# Patient Record
Sex: Female | Born: 1980 | Race: Black or African American | Hispanic: No | Marital: Single | Smoking: Never smoker
Health system: Southern US, Community
[De-identification: ages and names within clinical notes are randomized; demographics above are authoritative.]

## PROBLEM LIST (undated history)

## (undated) DIAGNOSIS — R011 Cardiac murmur, unspecified: Secondary | ICD-10-CM

## (undated) DIAGNOSIS — E785 Hyperlipidemia, unspecified: Secondary | ICD-10-CM

## (undated) DIAGNOSIS — Z0000 Encounter for general adult medical examination without abnormal findings: Principal | ICD-10-CM

## (undated) DIAGNOSIS — Z98891 History of uterine scar from previous surgery: Secondary | ICD-10-CM

## (undated) DIAGNOSIS — K589 Irritable bowel syndrome without diarrhea: Secondary | ICD-10-CM

## (undated) HISTORY — DX: Hyperlipidemia, unspecified: E78.5

## (undated) HISTORY — DX: Cardiac murmur, unspecified: R01.1

## (undated) HISTORY — PX: WISDOM TOOTH EXTRACTION: SHX21

## (undated) HISTORY — DX: Encounter for general adult medical examination without abnormal findings: Z00.00

---

## 2011-02-12 LAB — HM COLONOSCOPY

## 2014-04-30 LAB — OB RESULTS CONSOLE HEPATITIS B SURFACE ANTIGEN: Hepatitis B Surface Ag: NEGATIVE

## 2014-04-30 LAB — OB RESULTS CONSOLE RUBELLA ANTIBODY, IGM: Rubella: IMMUNE

## 2014-04-30 LAB — OB RESULTS CONSOLE RPR: RPR: NONREACTIVE

## 2014-04-30 LAB — OB RESULTS CONSOLE ABO/RH: RH TYPE: POSITIVE

## 2014-04-30 LAB — OB RESULTS CONSOLE ANTIBODY SCREEN: ANTIBODY SCREEN: NEGATIVE

## 2014-04-30 LAB — OB RESULTS CONSOLE HIV ANTIBODY (ROUTINE TESTING): HIV: NONREACTIVE

## 2014-12-09 ENCOUNTER — Encounter (HOSPITAL_COMMUNITY): Payer: Self-pay | Admitting: *Deleted

## 2014-12-09 ENCOUNTER — Inpatient Hospital Stay (HOSPITAL_COMMUNITY)
Admission: AD | Admit: 2014-12-09 | Discharge: 2014-12-13 | DRG: 766 | Disposition: A | Discharge status: Home / Self Care | Payer: 59 | Source: Ambulatory Visit | Attending: Obstetrics and Gynecology | Admitting: Obstetrics and Gynecology

## 2014-12-09 DIAGNOSIS — K589 Irritable bowel syndrome without diarrhea: Secondary | ICD-10-CM | POA: Diagnosis present

## 2014-12-09 DIAGNOSIS — O9902 Anemia complicating childbirth: Secondary | ICD-10-CM | POA: Diagnosis present

## 2014-12-09 DIAGNOSIS — O9952 Diseases of the respiratory system complicating childbirth: Secondary | ICD-10-CM | POA: Diagnosis present

## 2014-12-09 DIAGNOSIS — Z3A38 38 weeks gestation of pregnancy: Secondary | ICD-10-CM | POA: Diagnosis present

## 2014-12-09 DIAGNOSIS — Z98891 History of uterine scar from previous surgery: Secondary | ICD-10-CM

## 2014-12-09 DIAGNOSIS — D649 Anemia, unspecified: Secondary | ICD-10-CM | POA: Diagnosis present

## 2014-12-09 HISTORY — DX: Irritable bowel syndrome, unspecified: K58.9

## 2014-12-09 HISTORY — DX: History of uterine scar from previous surgery: Z98.891

## 2014-12-09 LAB — TYPE AND SCREEN
ABO/RH(D): A POS
ANTIBODY SCREEN: NEGATIVE

## 2014-12-09 LAB — CBC
HCT: 37.9 % (ref 36.0–46.0)
Hemoglobin: 12.5 g/dL (ref 12.0–15.0)
MCH: 29.5 pg (ref 26.0–34.0)
MCHC: 33 g/dL (ref 30.0–36.0)
MCV: 89.4 fL (ref 78.0–100.0)
PLATELETS: 177 10*3/uL (ref 150–400)
RBC: 4.24 MIL/uL (ref 3.87–5.11)
RDW: 13.1 % (ref 11.5–15.5)
WBC: 8.8 10*3/uL (ref 4.0–10.5)

## 2014-12-09 NOTE — MAU Provider Note (Signed)
MAU Addendum Note  4-5/90/-3 BBW  Admit to L&D Pr desire water birth  Dagmawi Venable, CNM, MSN 12/09/2014. 10:18 PM

## 2014-12-09 NOTE — MAU Provider Note (Signed)
Mary Cunningham is a 34 y.o. G1P0 at 38.5 weeks presents to MAU c/o ctx, urge for BM and decreased FM.  She denies vb or lof.   History     There are no active problems to display for this patient.   Chief Complaint  Patient presents with  . Contractions   HPI  OB History    Gravida Para Term Preterm AB TAB SAB Ectopic Multiple Living   1               Past Medical History  Diagnosis Date  . Medical history non-contributory     Past Surgical History  Procedure Laterality Date  . Wisdom tooth extraction      No family history on file.  History  Substance Use Topics  . Smoking status: Never Smoker   . Smokeless tobacco: Not on file  . Alcohol Use: No    Allergies: Allergies not on file  No prescriptions prior to admission    ROS See HPI above, all other systems are negative  Physical Exam   There were no vitals taken for this visit.  Physical Exam Ext:  WNL ABD: Soft, non tender to palpation, no rebound or guarding SVE: 3/70/-4   ED Course  Assessment: IUP at  38.5weeks Membranes:intact FHR: Category 1 CTX:  8 minutes   Plan: NST Recheck in 1 hour    Anatalia Kronk, CNM, MSN 12/09/2014. 9:06 PM

## 2014-12-09 NOTE — H&P (Signed)
Mary Cunningham is a 34 y.o. female, G1 P0 at 38.6 weeks  Patient Active Problem List   Diagnosis Date Noted  . Active labor at term 12/10/2014    Pregnancy Course: Patient entered care at 12.2 weeks.   EDC of 3-26/16 was established by EDD.      US evaluations:   19.6 weeks - anatomy: EFW  12oz - 71.4%, cervical length 3.74, FHR 144, breech, anterior placenta, female    Significant prenatal events:   IBS, heart murmer as a child   Last evaluation:   38.3 weeks   VE:0/0/-4 on  3./15/16  Reason for admission labor  Pt States:   Contractions Frequency: 8 minutes         Contraction severity: moderate         Fetal activity: +FM  OB History    Gravida Para Term Preterm AB TAB SAB Ectopic Multiple Living   1              Past Medical History  Diagnosis Date  . Irritable bowel syndrome (IBS)    Past Surgical History  Procedure Laterality Date  . Wisdom tooth extraction     Family History: family history is not on file. Social History:  reports that she has never smoked. She does not have any smokeless tobacco history on file. She reports that she does not drink alcohol or use illicit drugs.   Prenatal Transfer Tool  Maternal Diabetes: No Genetic Screening: Normal Maternal Ultrasounds/Referrals: Normal Fetal Ultrasounds or other Referrals:  None Maternal Substance Abuse:  No Significant Maternal Medications:  None Significant Maternal Lab Results: None   ROS:  See HPI above, all other systems are negative  No Known Allergies Dilation: 4.5 Effacement (%): 90 Station: -2 Exam by:: cnm per pt Blood pressure 125/66, pulse 68, temperature 98.7 F (37.1 C), temperature source Oral, resp. rate 16, height 5\' 1"  (1.549 m), weight 214 lb (97.07 kg).  Maternal Exam:  Uterine Assessment: Contraction frequency is rare.  Abdomen: Gravid, non tender. Fundal height is aga.  Normal external genitalia, vulva, cervix, uterus and adnexa.  No lesions noted on exam.  Pelvis  adequate for delivery.  Fetal presentation: Vertex by VE  Fetal Exam:  Monitor Surveillance : Continuous Monitoring Mode: Ultrasound.  NICHD: Category 1 CTXs: occasional EFW   7 lbs  Physical Exam: Nursing note and vitals reviewed General: alert and cooperative She appears well nourished Psychiatric: Normal mood and affect. Her behavior is normal Head: Normocephalic Eyes: Pupils are equal, round, and reactive to light Neck: Normal range of motion Cardiovascular: RRR without murmur  Respiratory: CTAB. Effort normal  Abd: soft, non-tender, +BS, no rebound, no guarding  Genitourinary: Vagina normal  Neurological: A&Ox3 Skin: Warm and dry  Musculoskeletal: Normal range of motion  Homan's sign negative bilaterally No evidence of DVTs.  Edema: Minimal bilaterally non-pitting edema DTR: 2+ Clonus: None   Prenatal labs: ABO, Rh: --/--/A POS (03/17 2306) Antibody: NEG (03/17 2306) Rubella:   immune RPR: Nonreactive (08/07 0000)  HBsAg: Negative (08/07 0000)  HB C negative HIV: Non-reactive (08/07 0000)  GBS:  negative Sickle cell/Hgb electrophoresis:  WNL HSV1 & 2 negative Pap:  wnl GC:   negative Chlamydia: negative Genetic screenings:   Glucola:  wnl  Assessment:  IUP at 38.5 weeks NICHD: Category 1 Membranes: BBW GBS negative  Plan:  Admit to L&D for expectant/active management of labor. Possible augmentation options reviewed including foley bulb, AROM and/or pitocin.  IV pain medication  per orders PRN Epidural per patient request Foley cath after patient is comfortable with epidural Anticipate SVD  Labor mgmt as ordered Desires water birth   Okay to ambulate around unit with wireless monitors  Okay to get up and shower without monitoring   May auscultate FHR intermittently,  if expectant management     q 30 min in active labor     q 15 min in transition     q 5 min with pushing.     May ambulate without monitoring.     If no active labor, may do  NST q 2 hours.   Attending MD available at all times.  [name], CNM, MSN 12/10/2014, 12:58 AM       All information will be confirmed upon admisson

## 2014-12-09 NOTE — MAU Note (Signed)
Pt reportts having ctx ion and off all day got stronger this evening 5pm. Denies SROM or bleeding at this time. Reprots decreased fetal movement since ctx stared getting stronger.

## 2014-12-10 ENCOUNTER — Encounter (HOSPITAL_COMMUNITY)
Admission: AD | Disposition: A | Discharge status: Home / Self Care | Payer: Self-pay | Source: Ambulatory Visit | Attending: Obstetrics and Gynecology

## 2014-12-10 ENCOUNTER — Inpatient Hospital Stay (HOSPITAL_COMMUNITY): Payer: 59 | Admitting: Anesthesiology

## 2014-12-10 ENCOUNTER — Encounter (HOSPITAL_COMMUNITY): Payer: Self-pay | Admitting: Certified Registered Nurse Anesthetist

## 2014-12-10 ENCOUNTER — Encounter (HOSPITAL_COMMUNITY): Payer: Self-pay | Admitting: Anesthesiology

## 2014-12-10 LAB — OB RESULTS CONSOLE GBS: GBS: NEGATIVE

## 2014-12-10 LAB — ABO/RH: ABO/RH(D): A POS

## 2014-12-10 LAB — RPR: RPR Ser Ql: NONREACTIVE

## 2014-12-10 SURGERY — Surgical Case
Anesthesia: Regional

## 2014-12-10 MED ORDER — MORPHINE SULFATE (PF) 0.5 MG/ML IJ SOLN
INTRAMUSCULAR | Status: DC | PRN
  Administered 2014-12-10: 3 mg via EPIDURAL

## 2014-12-10 MED ORDER — ACETAMINOPHEN 500 MG PO TABS
1000.0000 mg | ORAL_TABLET | Freq: Four times a day (QID) | ORAL | Status: AC
  Administered 2014-12-10 – 2014-12-11 (×3): 1000 mg via ORAL
  Filled 2014-12-10 (×3): qty 2

## 2014-12-10 MED ORDER — LACTATED RINGERS IV SOLN
500.0000 mL | Freq: Once | INTRAVENOUS | Status: AC
  Administered 2014-12-10: 08:00:00 via INTRAVENOUS

## 2014-12-10 MED ORDER — OXYCODONE-ACETAMINOPHEN 5-325 MG PO TABS: 1.0000 | ORAL_TABLET | ORAL | Status: DC | PRN

## 2014-12-10 MED ORDER — MORPHINE SULFATE 0.5 MG/ML IJ SOLN
INTRAMUSCULAR | Status: AC
  Filled 2014-12-10: qty 10

## 2014-12-10 MED ORDER — IBUPROFEN 600 MG PO TABS
600.0000 mg | ORAL_TABLET | Freq: Four times a day (QID) | ORAL | Status: DC
  Administered 2014-12-10 – 2014-12-13 (×12): 600 mg via ORAL
  Filled 2014-12-10 (×12): qty 1

## 2014-12-10 MED ORDER — EPHEDRINE 5 MG/ML INJ: 10.0000 mg | INTRAVENOUS | Status: DC | PRN

## 2014-12-10 MED ORDER — NALBUPHINE HCL 10 MG/ML IJ SOLN: 5.0000 mg | Freq: Once | INTRAMUSCULAR | Status: AC | PRN

## 2014-12-10 MED ORDER — MENTHOL 3 MG MT LOZG: 1.0000 | LOZENGE | OROMUCOSAL | Status: DC | PRN

## 2014-12-10 MED ORDER — OXYTOCIN 10 UNIT/ML IJ SOLN
40.0000 [IU] | INTRAVENOUS | Status: DC | PRN
  Administered 2014-12-10: 40 [IU] via INTRAVENOUS

## 2014-12-10 MED ORDER — NALBUPHINE HCL 10 MG/ML IJ SOLN: 5.0000 mg | INTRAMUSCULAR | Status: DC | PRN

## 2014-12-10 MED ORDER — KETOROLAC TROMETHAMINE 30 MG/ML IJ SOLN
30.0000 mg | Freq: Four times a day (QID) | INTRAMUSCULAR | Status: AC | PRN
  Administered 2014-12-10: 30 mg via INTRAMUSCULAR

## 2014-12-10 MED ORDER — LACTATED RINGERS IV SOLN: 500.0000 mL | INTRAVENOUS | Status: DC | PRN

## 2014-12-10 MED ORDER — DIPHENHYDRAMINE HCL 25 MG PO CAPS: 25.0000 mg | ORAL_CAPSULE | ORAL | Status: DC | PRN

## 2014-12-10 MED ORDER — SENNOSIDES-DOCUSATE SODIUM 8.6-50 MG PO TABS
2.0000 | ORAL_TABLET | ORAL | Status: DC
  Administered 2014-12-10 – 2014-12-12 (×3): 2 via ORAL
  Filled 2014-12-10 (×3): qty 2

## 2014-12-10 MED ORDER — LANOLIN HYDROUS EX OINT: 1.0000 "application " | TOPICAL_OINTMENT | CUTANEOUS | Status: DC | PRN

## 2014-12-10 MED ORDER — ONDANSETRON HCL 4 MG/2ML IJ SOLN
INTRAMUSCULAR | Status: DC | PRN
  Administered 2014-12-10: 4 mg via INTRAVENOUS

## 2014-12-10 MED ORDER — NALOXONE HCL 0.4 MG/ML IJ SOLN: 0.4000 mg | INTRAMUSCULAR | Status: DC | PRN

## 2014-12-10 MED ORDER — SODIUM CHLORIDE 0.9 % IJ SOLN: 3.0000 mL | INTRAMUSCULAR | Status: DC | PRN

## 2014-12-10 MED ORDER — FLEET ENEMA 7-19 GM/118ML RE ENEM: 1.0000 | ENEMA | RECTAL | Status: DC | PRN

## 2014-12-10 MED ORDER — CEFAZOLIN SODIUM-DEXTROSE 2-3 GM-% IV SOLR
2.0000 g | INTRAVENOUS | Status: AC
  Administered 2014-12-10: 2 g via INTRAVENOUS
  Filled 2014-12-10: qty 50

## 2014-12-10 MED ORDER — OXYCODONE-ACETAMINOPHEN 5-325 MG PO TABS
2.0000 | ORAL_TABLET | ORAL | Status: DC | PRN
  Administered 2014-12-12: 2 via ORAL

## 2014-12-10 MED ORDER — PHENYLEPHRINE HCL 10 MG/ML IJ SOLN
INTRAMUSCULAR | Status: DC | PRN
  Administered 2014-12-10 (×3): 40 ug via INTRAVENOUS

## 2014-12-10 MED ORDER — SCOPOLAMINE 1 MG/3DAYS TD PT72
1.0000 | MEDICATED_PATCH | Freq: Once | TRANSDERMAL | Status: AC
  Administered 2014-12-10: 1.5 mg via TRANSDERMAL

## 2014-12-10 MED ORDER — KETOROLAC TROMETHAMINE 30 MG/ML IJ SOLN: 30.0000 mg | Freq: Four times a day (QID) | INTRAMUSCULAR | Status: AC | PRN

## 2014-12-10 MED ORDER — OXYTOCIN 40 UNITS IN LACTATED RINGERS INFUSION - SIMPLE MED
62.5000 mL/h | INTRAVENOUS | Status: AC
Start: 2014-12-10 — End: 2014-12-11

## 2014-12-10 MED ORDER — ZOLPIDEM TARTRATE 5 MG PO TABS: 5.0000 mg | ORAL_TABLET | Freq: Every evening | ORAL | Status: DC | PRN

## 2014-12-10 MED ORDER — ONDANSETRON HCL 4 MG/2ML IJ SOLN
INTRAMUSCULAR | Status: AC
  Filled 2014-12-10: qty 2

## 2014-12-10 MED ORDER — SCOPOLAMINE 1 MG/3DAYS TD PT72
MEDICATED_PATCH | TRANSDERMAL | Status: AC
  Filled 2014-12-10: qty 1

## 2014-12-10 MED ORDER — ONDANSETRON HCL 4 MG/2ML IJ SOLN: 4.0000 mg | Freq: Four times a day (QID) | INTRAMUSCULAR | Status: DC | PRN

## 2014-12-10 MED ORDER — LIDOCAINE HCL (PF) 1 % IJ SOLN
INTRAMUSCULAR | Status: DC | PRN
  Administered 2014-12-10 (×2): 4 mL

## 2014-12-10 MED ORDER — WITCH HAZEL-GLYCERIN EX PADS: 1.0000 "application " | MEDICATED_PAD | CUTANEOUS | Status: DC | PRN

## 2014-12-10 MED ORDER — DIPHENHYDRAMINE HCL 50 MG/ML IJ SOLN: 12.5000 mg | INTRAMUSCULAR | Status: DC | PRN

## 2014-12-10 MED ORDER — ONDANSETRON HCL 4 MG/2ML IJ SOLN: 4.0000 mg | Freq: Once | INTRAMUSCULAR | Status: DC | PRN

## 2014-12-10 MED ORDER — SIMETHICONE 80 MG PO CHEW
80.0000 mg | CHEWABLE_TABLET | ORAL | Status: DC
  Administered 2014-12-10 – 2014-12-12 (×3): 80 mg via ORAL
  Filled 2014-12-10 (×2): qty 1

## 2014-12-10 MED ORDER — DIPHENHYDRAMINE HCL 50 MG/ML IJ SOLN
12.5000 mg | INTRAMUSCULAR | Status: DC | PRN
  Administered 2014-12-10: 12.5 mg via INTRAVENOUS
  Filled 2014-12-10: qty 1

## 2014-12-10 MED ORDER — TETANUS-DIPHTH-ACELL PERTUSSIS 5-2.5-18.5 LF-MCG/0.5 IM SUSP: 0.5000 mL | Freq: Once | INTRAMUSCULAR | Status: DC

## 2014-12-10 MED ORDER — ACETAMINOPHEN 325 MG PO TABS: 650.0000 mg | ORAL_TABLET | ORAL | Status: DC | PRN

## 2014-12-10 MED ORDER — SIMETHICONE 80 MG PO CHEW
80.0000 mg | CHEWABLE_TABLET | Freq: Three times a day (TID) | ORAL | Status: DC
  Administered 2014-12-10 – 2014-12-13 (×8): 80 mg via ORAL
  Filled 2014-12-10 (×10): qty 1

## 2014-12-10 MED ORDER — LACTATED RINGERS IV SOLN
INTRAVENOUS | Status: DC | PRN
  Administered 2014-12-10 (×2): via INTRAVENOUS

## 2014-12-10 MED ORDER — EPHEDRINE 5 MG/ML INJ
10.0000 mg | INTRAVENOUS | Status: DC | PRN
Start: 2014-12-10 — End: 2014-12-10

## 2014-12-10 MED ORDER — FENTANYL 2.5 MCG/ML BUPIVACAINE 1/10 % EPIDURAL INFUSION (WH - ANES)
INTRAMUSCULAR | Status: DC | PRN
  Administered 2014-12-10: 14 mL/h via EPIDURAL

## 2014-12-10 MED ORDER — OXYCODONE-ACETAMINOPHEN 5-325 MG PO TABS
1.0000 | ORAL_TABLET | ORAL | Status: DC | PRN
  Administered 2014-12-11 – 2014-12-13 (×4): 1 via ORAL
  Filled 2014-12-10 (×6): qty 1

## 2014-12-10 MED ORDER — OXYTOCIN 10 UNIT/ML IJ SOLN
INTRAMUSCULAR | Status: AC
  Filled 2014-12-10: qty 4

## 2014-12-10 MED ORDER — MEPERIDINE HCL 25 MG/ML IJ SOLN
6.2500 mg | INTRAMUSCULAR | Status: DC | PRN
Start: 2014-12-10 — End: 2014-12-10

## 2014-12-10 MED ORDER — PHENYLEPHRINE 40 MCG/ML (10ML) SYRINGE FOR IV PUSH (FOR BLOOD PRESSURE SUPPORT)
80.0000 ug | PREFILLED_SYRINGE | INTRAVENOUS | Status: DC | PRN
Start: 2014-12-10 — End: 2014-12-10

## 2014-12-10 MED ORDER — CITRIC ACID-SODIUM CITRATE 334-500 MG/5ML PO SOLN
30.0000 mL | ORAL | Status: DC | PRN
  Administered 2014-12-10: 30 mL via ORAL
  Filled 2014-12-10: qty 15

## 2014-12-10 MED ORDER — PHENYLEPHRINE 40 MCG/ML (10ML) SYRINGE FOR IV PUSH (FOR BLOOD PRESSURE SUPPORT)
80.0000 ug | PREFILLED_SYRINGE | INTRAVENOUS | Status: DC | PRN
  Filled 2014-12-10: qty 20

## 2014-12-10 MED ORDER — LACTATED RINGERS IV SOLN: INTRAVENOUS | Status: DC

## 2014-12-10 MED ORDER — OXYTOCIN 40 UNITS IN LACTATED RINGERS INFUSION - SIMPLE MED: 62.5000 mL/h | INTRAVENOUS | Status: DC

## 2014-12-10 MED ORDER — DIBUCAINE 1 % RE OINT: 1.0000 "application " | TOPICAL_OINTMENT | RECTAL | Status: DC | PRN

## 2014-12-10 MED ORDER — SIMETHICONE 80 MG PO CHEW: 80.0000 mg | CHEWABLE_TABLET | ORAL | Status: DC | PRN

## 2014-12-10 MED ORDER — PRENATAL MULTIVITAMIN CH
1.0000 | ORAL_TABLET | Freq: Every day | ORAL | Status: DC
  Administered 2014-12-11 – 2014-12-13 (×3): 1 via ORAL
  Filled 2014-12-10 (×3): qty 1

## 2014-12-10 MED ORDER — ONDANSETRON HCL 4 MG/2ML IJ SOLN: 4.0000 mg | Freq: Three times a day (TID) | INTRAMUSCULAR | Status: DC | PRN

## 2014-12-10 MED ORDER — SODIUM BICARBONATE 8.4 % IV SOLN
INTRAVENOUS | Status: DC | PRN
  Administered 2014-12-10: 3 mL via EPIDURAL
  Administered 2014-12-10 (×2): 5 mL via EPIDURAL

## 2014-12-10 MED ORDER — LIDOCAINE HCL (PF) 1 % IJ SOLN: 30.0000 mL | INTRAMUSCULAR | Status: DC | PRN

## 2014-12-10 MED ORDER — OXYCODONE-ACETAMINOPHEN 5-325 MG PO TABS: 2.0000 | ORAL_TABLET | ORAL | Status: DC | PRN

## 2014-12-10 MED ORDER — BUTORPHANOL TARTRATE 1 MG/ML IJ SOLN
1.0000 mg | INTRAMUSCULAR | Status: DC | PRN
  Administered 2014-12-10: 1 mg via INTRAVENOUS
  Filled 2014-12-10: qty 1

## 2014-12-10 MED ORDER — LACTATED RINGERS IV SOLN
INTRAVENOUS | Status: DC
  Administered 2014-12-10: 19:00:00 via INTRAVENOUS

## 2014-12-10 MED ORDER — KETOROLAC TROMETHAMINE 30 MG/ML IJ SOLN
INTRAMUSCULAR | Status: AC
  Administered 2014-12-10: 30 mg via INTRAMUSCULAR
  Filled 2014-12-10: qty 1

## 2014-12-10 MED ORDER — DIPHENHYDRAMINE HCL 25 MG PO CAPS: 25.0000 mg | ORAL_CAPSULE | Freq: Four times a day (QID) | ORAL | Status: DC | PRN

## 2014-12-10 MED ORDER — FENTANYL 2.5 MCG/ML BUPIVACAINE 1/10 % EPIDURAL INFUSION (WH - ANES)
14.0000 mL/h | INTRAMUSCULAR | Status: DC | PRN
  Administered 2014-12-10: 14 mL/h via EPIDURAL
  Filled 2014-12-10: qty 125

## 2014-12-10 MED ORDER — NALOXONE HCL 1 MG/ML IJ SOLN
1.0000 ug/kg/h | INTRAVENOUS | Status: DC | PRN
  Filled 2014-12-10: qty 2

## 2014-12-10 MED ORDER — OXYTOCIN BOLUS FROM INFUSION: 500.0000 mL | INTRAVENOUS | Status: DC

## 2014-12-10 MED ORDER — LACTATED RINGERS IV SOLN
INTRAVENOUS | Status: DC | PRN
  Administered 2014-12-10: 09:00:00 via INTRAVENOUS

## 2014-12-10 MED ORDER — FENTANYL CITRATE 0.05 MG/ML IJ SOLN: 25.0000 ug | INTRAMUSCULAR | Status: DC | PRN

## 2014-12-10 SURGICAL SUPPLY — 37 items
CLAMP CORD UMBIL (MISCELLANEOUS) IMPLANT
CLOTH BEACON ORANGE TIMEOUT ST (SAFETY) ×3 IMPLANT
DERMABOND ADVANCED (GAUZE/BANDAGES/DRESSINGS) ×2
DERMABOND ADVANCED .7 DNX12 (GAUZE/BANDAGES/DRESSINGS) ×1 IMPLANT
DRAPE SHEET LG 3/4 BI-LAMINATE (DRAPES) IMPLANT
DRSG OPSITE POSTOP 4X10 (GAUZE/BANDAGES/DRESSINGS) ×3 IMPLANT
DURAPREP 26ML APPLICATOR (WOUND CARE) ×3 IMPLANT
ELECT REM PT RETURN 9FT ADLT (ELECTROSURGICAL) ×3
ELECTRODE REM PT RTRN 9FT ADLT (ELECTROSURGICAL) ×1 IMPLANT
EXTRACTOR VACUUM M CUP 4 TUBE (SUCTIONS) IMPLANT
EXTRACTOR VACUUM M CUP 4' TUBE (SUCTIONS)
GLOVE BIOGEL PI IND STRL 7.0 (GLOVE) ×1 IMPLANT
GLOVE BIOGEL PI INDICATOR 7.0 (GLOVE) ×2
GLOVE SURG SS PI 6.5 STRL IVOR (GLOVE) ×3 IMPLANT
GOWN STRL REUS W/TWL LRG LVL3 (GOWN DISPOSABLE) ×6 IMPLANT
KIT ABG SYR 3ML LUER SLIP (SYRINGE) IMPLANT
LIQUID BAND (GAUZE/BANDAGES/DRESSINGS) IMPLANT
NEEDLE HYPO 25X5/8 SAFETYGLIDE (NEEDLE) IMPLANT
NS IRRIG 1000ML POUR BTL (IV SOLUTION) ×3 IMPLANT
PACK C SECTION WH (CUSTOM PROCEDURE TRAY) ×3 IMPLANT
PAD ABD 7.5X8 STRL (GAUZE/BANDAGES/DRESSINGS) ×3 IMPLANT
PAD OB MATERNITY 4.3X12.25 (PERSONAL CARE ITEMS) ×3 IMPLANT
RTRCTR C-SECT PINK 25CM LRG (MISCELLANEOUS) ×3 IMPLANT
SUT CHROMIC 1 CTX 36 (SUTURE) IMPLANT
SUT CHROMIC 2 0 CT 1 (SUTURE) ×3 IMPLANT
SUT MON AB 4-0 PS1 27 (SUTURE) ×3 IMPLANT
SUT PLAIN 1 NONE 54 (SUTURE) IMPLANT
SUT PLAIN 2 0 (SUTURE)
SUT PLAIN 2 0 XLH (SUTURE) IMPLANT
SUT PLAIN ABS 2-0 CT1 27XMFL (SUTURE) IMPLANT
SUT VIC AB 0 CTX 36 (SUTURE) ×2
SUT VIC AB 0 CTX36XBRD ANBCTRL (SUTURE) ×1 IMPLANT
SUT VIC AB 1 CTX 36 (SUTURE) ×4
SUT VIC AB 1 CTX36XBRD ANBCTRL (SUTURE) ×2 IMPLANT
TAPE CLOTH .5X10 WHT NS (GAUZE/BANDAGES/DRESSINGS) ×3 IMPLANT
TOWEL OR 17X24 6PK STRL BLUE (TOWEL DISPOSABLE) ×3 IMPLANT
TRAY FOLEY CATH 14FR (SET/KITS/TRAYS/PACK) ×3 IMPLANT

## 2014-12-10 NOTE — Addendum Note (Signed)
Addendum  created 12/10/14 1408 by Yolonda KidaAlison L Deaveon Schoen, CRNA   Modules edited: Notes Section   Notes Section:  File: [id number]

## 2014-12-10 NOTE — Progress Notes (Signed)
Labor Progress LE  Subjective: Managing labor in the tub and the shower  Objective: BP 126/63 mmHg  Pulse 67  Temp(Src) 99.6 F (37.6 C) (Oral)  Resp 20  Ht 5\' 1"  (1.549 m)  Wt 214 lb (97.07 kg)  BMI 40.46 kg/m2     FHT: 145, + accel,  CTX:  regular, every 3-4 minutes Uterus gravid, soft non tender SVE:  7-8/90/-3   Assessment:  IUP at 38.6 weeks Membranes:  BBW Labor progress: adquate labor progress GBS: negative    Plan: Continue labor plan intermittent monitoring Rest /Ambulate Frequent position changes to facilitate fetal rotation and descent. Will reassess with cervical exam at 0630or earlier if necessary      Aleysha Meckler, CNM, MSN 12/10/2014. 6:00 AM

## 2014-12-10 NOTE — Anesthesia Postprocedure Evaluation (Signed)
  Anesthesia Post-op Note  Patient: Mary Cunningham  Procedure(s) Performed: Procedure(s) (LRB): CESAREAN SECTION (N/A)  Patient Location: PACU  Anesthesia Type: Spinal  Level of Consciousness: awake and alert   Airway and Oxygen Therapy: Patient Spontanous Breathing  Post-op Pain: mild  Post-op Assessment: Post-op Vital signs reviewed, Patient's Cardiovascular Status Stable, Respiratory Function Stable, Patent Airway and No signs of Nausea or vomiting  Last Vitals:  Filed Vitals:   12/10/14 1100  BP:   Pulse: 54  Temp:   Resp: 11    Post-op Vital Signs: stable   Complications: No apparent anesthesia complications

## 2014-12-10 NOTE — Procedures (Cosign Needed)
DATE OF SURGERY: 12/09/2013   PREOP DIAGNOSIS:  1. 38 week 6 day  EGA intrauterine pregnancy 2. Abnormal fetal heart tracing with prolonged deceleration.  2.  Remote from delivery at 8 cm dilation.    POSTOP DIAGNOSIS: Same as above.  PROCEDURE: Emergency Primary low uterine segment transverse cesarean section via Pfannenstiel incision.     SURGEON: Dr.  Angus Palms Sallye Ober  ASSISTANT: Gerrit Heck. CNM  ANESTHESIA: Epidural  COMPLICATIONS: None  FINDINGS: Viable female infant in cephalic presentation, DOA, weight 6 pounds  10 ounces, Apgar scores of 1 and 8. Normal uterus, fallopian tubes and ovaries bilaterally.    EBL: 500 cc  IV FLUID: See brief op note  URINE OUTPUT: See brief open note.  INDICATIONS: 34 y/o P0 who presented in labor.  She progressed to 8 cm and had recurrent variable decelerations then a prolonged deceleration that did not resolve with intrauterine resuscitation. As she was remote from delivery with abnormal fetal heart tracing it was decided to proceed with a primary cesarean section delivery.  She was consented for the procedure after explaining risks benefits and alternatives of the procedure including risks of bleeding infection and damage to organs.    PROCEDURE:   Informed consent was obtained from the patient to undergo the procedure. She was taken to the operating room where her epidural anesthesia was found to be adequate. She was prepped and draped in the usual sterile fashion and a Foley catheter was placed. She received 2 g of IV Ancef preoperatively. A Pfannenstiel incision was made with the scalpel and the incision extended through the subcutaneous layer and also the fascia. Small perforators in the subcutaneous layer were contained with the Bovie. The fascia was nicked in the midline and then was further separated from the rectus muscles bilaterally using Mayo scissors. Kochers were placed inferiorly and then superiorly to allow further separation of fascia  from the rectus muscles.  The peritoneal cavity was entered bluntly with the fingers. The Alexis retractor was placed in. The bladder flap was created using Metzenbaum scissors.   The uterus was incised with a scalpel and the incision extended bluntly bilaterally with fingers. Clear amniotic fluid was noted.  The head then the rest of the body was then delivered with abdominal pressure, nuchal cord was also noted with fetal head delivery.  She delivered a viable female infant, apgar scores 1, 8.  The cord was clamped and cut. Cord pH, cord blood were collected.    The uterus was exteriorized.  The placenta was delivered with gentle traction on the umbilical cord. The edges of the uterus was grasped with Allis clamps and also T. Clamps. The uterus was cleared of clots and debris with a lap.  The incision uterine incision was closed with #1 Vicryl in a running locked stitch. An imbricating layer of came stitch was placed over the initial closure. The uterus was returned into the abdomen.  Irrigation was applied and suctioned out. Excellent hemostasis was noted over the incision.  The muscles were then reapproximated using chromic suture.  Fascia was closed using 0 Vicryl in a running stitch. The subcutaneous layer was irrigated and suctioned out. Small perforators were contained with the bovie.  The subcutaneous was closed over using 1-0 plain in interrupted stitches. The skin was closed using 4-0 Monocryl. Dermabond then  Honeycomb then pressure dressing was applied. The patient was then cleaned and she was taken to the recovery room in stable condition. The neonate was also taken to  the nursery in stable condition.   SPECIMEN: Placenta, umbilical cord blood  DISPOSITION: TO PACU, STABLE.

## 2014-12-10 NOTE — Progress Notes (Signed)
Tylene Fantasiashlee S Marcotte MRN: [id number]  Subjective: -Patient reports rectal pressure.  States she "just wants to get him out."  Patient breathing well, with staff direction, through contractions.  Reports some contractions stronger than others.    Objective: BP 117/69 mmHg  Pulse 61  Temp(Src) 99.6 F (37.6 C) (Oral)  Resp 18  Ht 5\' 1"  (1.549 m)  Wt 214 lb (97.07 kg)  BMI 40.46 kg/m2     FHT: 145 bpm, Mod Var, Var. Decels, +Accels UC: Q3-404min, palpates strong   SVE:   Dilation: 8 Effacement (%): 80, 90 Station: -1 Exam by:: Herma CarsonLindsay Lima, RN Membranes: AROM at 0600 Pitocin: None  Assessment:  IUP at 38.6 weeks Cat II FT  Active Labor-Transition  Plan: -Cat II FT resolved with position change, continue to promote fetal descent and rotation -Patient instructed on nonpharmacologic techniques including breathing, position change, and guided imagery -Nurse call and report patient requests epidural--okay for epidural -Continue other mgmt as ordered  Tecla Mailloux LYNN,MSN, CNM 12/10/2014, 8:02 AM

## 2014-12-10 NOTE — Transfer of Care (Signed)
Immediate Anesthesia Transfer of Care Note  Patient: Mary Cunningham  Procedure(s) Performed: Procedure(s): CESAREAN SECTION (N/A)  Patient Location: PACU  Anesthesia Type:Epidural  Level of Consciousness: awake, alert , oriented and patient cooperative  Airway & Oxygen Therapy: Patient Spontanous Breathing  Post-op Assessment: Report given to RN and Post -op Vital signs reviewed and stable  Post vital signs: Reviewed and stable  Last Vitals:  Filed Vitals:   12/10/14 0842  BP: 118/63  Pulse: 96  Temp:   Resp:     Complications: No apparent anesthesia complications

## 2014-12-10 NOTE — Progress Notes (Addendum)
Mary Cunningham MRN: [id number]  Subjective: -Nurse call reporting prolonged decel.  In room to assess. Nurse reports cervix unchanged. Patient in left tilt, O2 on, S/P epidural-bp normal.  Objective: BP 112/68 mmHg  Pulse 94  Temp(Src) 99.6 F (37.6 C) (Oral)  Resp 18  Ht 5\' 1"  (1.549 m)  Wt 214 lb (97.07 kg)  BMI 40.46 kg/m2     FHT: 65 bpm x 8 mins, Mod Var UC:  None graphed  SVE:   Dilation: 8 Effacement (%): 80, 90 Station: -1 Exam by:: linsay lima Membranes: AROM Pitocin: None  Assessment:  IUP at 38.6wks Cat II FT  Active Labor-Transition Fetal Intolerance  Plan: -Placed in hands and knees position.  -Dr. Carmela HurtE. Emma Birchler called.  Recommendation for c/s due to NRFHT  -R/B of C/S discussed with patient, including risk of infection, bleeding, and damage to major organs and baby resulting in additional surgery. Patient understands and agrees to c/s -Consents signed -Prepped for C/S -2G ancef ordered -Necessary personnel notified  Gerrit HeckMLY, JESSICA [facility]byterian/Law[facility] HospitalYNN,MSN, CNM 12/10/2014, 8:41 AM   I examined fetal heart tracing before deciding whether cesarean delivery. I also spoke to the patient in the operating room before starting the procedure. And agree with the above findings assessment and plan.  Dr. Sallye OberKulwa.

## 2014-12-10 NOTE — Brief Op Note (Signed)
12/09/2014 - 12/10/2014  6:25 PM  PATIENT:  Mary Cunningham  34 y.o. female  PRE-OPERATIVE DIAGNOSIS:  1.  Abnormal fetal heart tracing with prolonged deceleration. 2.  Remote from delivery.  POST-OPERATIVE DIAGNOSIS:  1.  Abnormal fetal heart tracing with prolonged deceleration. 2.  Remote from delivery. Marland Kitchen.  PROCEDURE:  Procedure(s): CESAREAN SECTION (N/A)  SURGEON:  Surgeon(s) and Role:    * Hoover BrownsEma Kaide Gage, MD - Primary   ASSISTANTS: Gerrit HeckJessica Emly, CNM.   ANESTHESIA:   epidural  EBL:  Total I/O In: 2360 [P.O.:360; I.V.:2000] Out: 1375 [Urine:825; Blood:550]  BLOOD ADMINISTERED:none  DRAINS: none   LOCAL MEDICATIONS USED:  NONE  SPECIMEN:  Source of Specimen:  Placenta. Cord PH.   DISPOSITION OF SPECIMEN:  PATHOLOGY  COUNTS:  YES  TOURNIQUET:  * No tourniquets in log *  DICTATION: .Dragon Dictation  PLAN OF CARE: Admit to inpatient   PATIENT DISPOSITION:  PACU - hemodynamically stable.   Delay start of Pharmacological VTE agent (>24hrs) due to surgical blood loss or risk of bleeding: not applicable

## 2014-12-10 NOTE — Progress Notes (Signed)
Mary Cunningham  6 Hours Post-Op   S/P Primary LTCS 2ndary to NRFHT  Subjective: Patient up resting in bed with infant skin to skin.  Denies pain, nausea/vomiting, and flatus.    Objective: Filed Vitals:   12/10/14 1100 12/10/14 1126 12/10/14 1230 12/10/14 1330  BP:  98/53 100/62 111/58  Pulse: 54 50 66 59  Temp:  97.6 F (36.4 C) 98 F (36.7 C) 98.1 F (36.7 C)  TempSrc:   Axillary Oral  Resp: 11 16 18 18   Height:      Weight:      SpO2: 97% 100% 97% 98%    Recent Labs  12/09/14 2250  HGB 12.5  HCT 37.9    Physical Exam:  General: alert, cooperative and no distress Mood/Affect: Appropriate/Bright Lungs: clear to auscultation, no wheezes, rales or rhonchi, symmetric air entry.  Heart: normal rate and regular rhythm. Breast: Soft, Nipples Intact. Abdomen:  + bowel sounds in RUQ, - in all other quadrants, Soft, Pressure Dressing in Place Uterine Fundus: firm, U/-1 Lochia: appropriate Foley catheter: Draining clear yellow fluid Laceration: None Skin: Warm, Dry. DVT Evaluation: No evidence of DVT seen on physical exam. No significant calf/ankle edema. SCD boots on and operational  Assessment 6 Hrs Post Op Hemodynamically Stable BreastFeeding  Plan: Discussed POC including advancement of movement and diet Pain management discussed Bowel function discussed No questions or concerns Desires inpatient circumcision-R/B discussed, questions/concerns addressed-consent signed Continue current care Dr. Carmela HurtE. Kulwa to be updated on patient status   Mary Cunningham, Mary FasterJESSICA LYNN, MSN, CNM 12/10/2014, 3:27 PM

## 2014-12-10 NOTE — Anesthesia Preprocedure Evaluation (Signed)
Anesthesia Evaluation  Patient identified by MRN, date of birth, ID band Patient awake    Reviewed: Allergy & Precautions, NPO status , Patient's Chart, lab work & pertinent test results  History of Anesthesia Complications Negative for: history of anesthetic complications  Airway Mallampati: II  TM Distance: >3 FB Neck ROM: Full    Dental no notable dental hx. (+) Dental Advisory Given   Pulmonary neg pulmonary ROS,  breath sounds clear to auscultation  Pulmonary exam normal       Cardiovascular Exercise Tolerance: Good negative cardio ROS  Rhythm:Regular Rate:Normal     Neuro/Psych negative neurological ROS  negative psych ROS   GI/Hepatic negative GI ROS, Neg liver ROS,   Endo/Other  negative endocrine ROS  Renal/GU negative Renal ROS  negative genitourinary   Musculoskeletal negative musculoskeletal ROS (+)   Abdominal   Peds negative pediatric ROS (+)  Hematology negative hematology ROS (+)   Anesthesia Other Findings   Reproductive/Obstetrics (+) Pregnancy                             Anesthesia Physical Anesthesia Plan  ASA: II  Anesthesia Plan: Epidural   Post-op Pain Management:    Induction:   Airway Management Planned:   Additional Equipment:   Intra-op Plan:   Post-operative Plan:   Informed Consent: I have reviewed the patients History and Physical, chart, labs and discussed the procedure including the risks, benefits and alternatives for the proposed anesthesia with the patient or authorized representative who has indicated his/her understanding and acceptance.   Dental advisory given  Plan Discussed with:   Anesthesia Plan Comments:         Anesthesia Quick Evaluation

## 2014-12-10 NOTE — Anesthesia Postprocedure Evaluation (Signed)
  Anesthesia Post-op Note  Patient: Mary Cunningham  Procedure(s) Performed: Procedure(s): CESAREAN SECTION (N/A)  Patient Location: Mother/Baby  Anesthesia Type:Epidural  Level of Consciousness: awake, alert , oriented and patient cooperative  Airway and Oxygen Therapy: Patient Spontanous Breathing  Post-op Pain: mild  Post-op Assessment: Post-op Vital signs reviewed, Patient's Cardiovascular Status Stable, Respiratory Function Stable, Patent Airway, No headache, No backache, No residual numbness and No residual motor weakness  Post-op Vital Signs: Reviewed and stable  Last Vitals:  Filed Vitals:   12/10/14 1126  BP: 98/53  Pulse: 50  Temp: 36.4 C  Resp: 16    Complications: No apparent anesthesia complications

## 2014-12-10 NOTE — Anesthesia Procedure Notes (Addendum)
Epidural Patient location during procedure: OB Start time: 12/10/2014 8:18 AM  Staffing Anesthesiologist: Karie SchwalbeJUDD, Samual Beals Performed by: anesthesiologist   Preanesthetic Checklist Completed: patient identified, site marked, surgical consent, pre-op evaluation, timeout performed, IV checked, risks and benefits discussed and monitors and equipment checked  Epidural Patient position: sitting Prep: site prepped and draped and DuraPrep Patient monitoring: continuous pulse ox and blood pressure Approach: midline Location: L3-L4 Injection technique: LOR saline  Needle:  Needle type: Tuohy  Needle gauge: 17 G Needle length: 9 cm and 9 Needle insertion depth: 8 cm Catheter type: closed end flexible Catheter size: 19 Gauge Catheter at skin depth: 13 cm Test dose: negative  Assessment Events: blood not aspirated, injection not painful, no injection resistance, negative IV test and no paresthesia  Additional Notes Patient identified. Risks/Benefits/Options discussed with patient including but not limited to bleeding, infection, nerve damage, paralysis, failed block, incomplete pain control, headache, blood pressure changes, nausea, vomiting, reactions to medication both or allergic, itching and postpartum back pain. Confirmed with bedside nurse the patient's most recent platelet count. Confirmed with patient that they are not currently taking any anticoagulation, have any bleeding history or any family history of bleeding disorders. Patient expressed understanding and wished to proceed. All questions were answered. Sterile technique was used throughout the entire procedure. Please see nursing notes for vital signs. Test dose was given through epidural catheter and negative prior to continuing to dose epidural or start infusion. Warning signs of high block given to the patient including shortness of breath, tingling/numbness in hands, complete motor block, or any concerning symptoms with instructions to  call for help. Patient was given instructions on fall risk and not to get out of bed. All questions and concerns addressed with instructions to call with any issues or inadequate analgesia.

## 2014-12-10 NOTE — Progress Notes (Signed)
Labor Progress  Subjective: 0600 Pt starting to feel over whelmed, asking for IV pain medication.  Discussed AROM, pt agreed. Pt tolerated well 0615 call back to room stat for prolong decel Dr Normand Sloopillard called  Objective: BP 126/63 mmHg  Pulse 67  Temp(Src) 99.6 F (37.6 C) (Oral)  Resp 20  Ht 5\' 1"  (1.549 m)  Wt 214 lb (97.07 kg)  BMI 40.46 kg/m2     FHT: 145, moderate variability, + accel prolong decels down to the 70 for a total of 14 minutes, w/return to baseline with moderate varibility  CTX:  regular, every 3-4 minutes Uterus gravid, soft non tender SVE:  Dilation: 8 Effacement (%): 90 Station: -2 Exam by:: V.Gerado Nabers,CNM  Assessment:  IUP at 38.3 weeks NICHD: Category 3 with active intrauterine resuscitative measures.  VE unremarkable for cord prolapse Membranes:  AROM x 15 minutes Labor progress: transition GBS: negative FSE placed  0700 Category 2, FHR 120, moderate variability, occasional accel, variable decels   Plan: Continue labor planContinuous monitoring Frequent position changes to facilitate fetal rotation and descent. Report given to Dr Normand Sloopillard and Gerrit HeckJessica Emly CNM      Angelo Prindle, CNM, MSN 12/10/2014. 6:09 AM

## 2014-12-11 LAB — CBC
HEMATOCRIT: 31.3 % — AB (ref 36.0–46.0)
Hemoglobin: 10.1 g/dL — ABNORMAL LOW (ref 12.0–15.0)
MCH: 29.4 pg (ref 26.0–34.0)
MCHC: 32.6 g/dL (ref 30.0–36.0)
MCV: 90.2 fL (ref 78.0–100.0)
Platelets: 166 10*3/uL (ref 150–400)
RBC: 3.47 MIL/uL — ABNORMAL LOW (ref 3.87–5.11)
RDW: 13.3 % (ref 11.5–15.5)
WBC: 13.9 10*3/uL — ABNORMAL HIGH (ref 4.0–10.5)

## 2014-12-11 NOTE — Lactation Note (Signed)
This note was copied from the chart of Mary Cunningham. Lactation Consultation Note  Patient Name: Mary Helaine Chessshlee Rea     Follow up consult with this mom and term baby, now 5524 hours old. Mom was having a difficult time latching on her right breast, which has an inverted nipple. Baby breast feeds very well form her left breast. i assisted mom with positioning and latching baby in football hold. He could not maintain latch until we added a 20 nipple shield. He suckled on this side for about 10 minutes, and then mom switched him to her left breast. After the shield was removed, there was no colostrum seen in the shield. I advised mom to begin each feeding on the right with the shield, and end with the left, and to allow the baby to feed as long as he wants. i also set up a DEP to protect mom's supply, and suggested she pump about 4 times a day, or every 6 hours. Mom knows to call for questions/cloncerns.   Today's Date: 12/11/2014     Maternal Data    Feeding    LATCH Score/Interventions                      Lactation Tools Discussed/Used     Consult Status      Alfred LevinsLee, Marvell Tamer Anne 12/11/2014, 2:27 PM

## 2014-12-11 NOTE — Progress Notes (Addendum)
Subjective: Postpartum Day 1: Cesarean Delivery due to NRFHT Patient up ad lib, reports no syncope or dizziness. Feeding:  breast Contraceptive plan:  Mirena  Objective: Vital signs in last 24 hours: Temp:  [97.6 F (36.4 C)-98.5 F (36.9 C)] 98.2 F (36.8 C) (03/19 0930) Pulse Rate:  [50-83] 83 (03/19 0930) Resp:  [11-22] 18 (03/19 0930) BP: (86-118)/(41-69) 108/66 mmHg (03/19 0930) SpO2:  [96 %-100 %] 100 % (03/19 0930)  Physical Exam:  General: alert and cooperative Lochia: appropriate Uterine Fundus: firm Abdomen:  + bowel sounds, non distended Incision: no significant drainage  Pressure dressing CDI DVT Evaluation: No evidence of DVT seen on physical exam. Homan's sign: Negative   Recent Labs  12/09/14 2250 12/11/14 0619  HGB 12.5 10.1*  HCT 37.9 31.3*  WBC 8.8 13.9*    Assessment: Status post Cesarean section day 1. Doing well postoperatively.  pressure dressing in place, no significant drainage Anemia - hemodynamicly stable.   Plan: Continue current care. Lactation consult and Circumcision prior to discharge Remove outer dressing later today    Venus Standard, CNM, MSN 12/11/2014. 10:03 AM  I saw and examined patient at bedside and agree with above findings assessment and plan.  Discussed risks benefits and alternatives of circumcising her neonate including risks of bleeding infection and damage to organs. All questions were answered.  May remove pressure dressing after showering.  Dr. Sallye OberKulwa.

## 2014-12-12 ENCOUNTER — Encounter (HOSPITAL_COMMUNITY): Payer: Self-pay | Admitting: Obstetrics and Gynecology

## 2014-12-12 DIAGNOSIS — Z98891 History of uterine scar from previous surgery: Secondary | ICD-10-CM

## 2014-12-12 HISTORY — DX: History of uterine scar from previous surgery: Z98.891

## 2014-12-12 NOTE — Lactation Note (Signed)
This note was copied from the chart of Mary Helaine Chessshlee Dimascio. Lactation Consultation Note  Patient Name: Mary Cunningham [id number]'WToday's Date: 12/12/2014 Reason for consult: Follow-up assessment   With this mom of a term baby, now 4550 hours old. Mom is concerned that the baby will not easily latch to her right breast, but is doing well with nipple shield. Mom reports milk seen in shield today. Mom's nurse, Brooke, assisted mom with latching baby in football hold, and I walked in with baby latched, lots of audible swalows. I placed pillows behind mom's back, and advised her to sit back, and mom was relieved to see how much more comfortable this was. Mom was advised to pump about 4 times a day to protect her supply, since she is using a nipple shield on the right breast. I gave mom her DEP s a [employer] employee. Mom knows to call for questions/concerns.   Maternal Data    Feeding Feeding Type: Breast Fed Length of feed: 30 min  LATCH Score/Interventions Latch: Grasps breast easily, tongue down, lips flanged, rhythmical sucking.  Audible Swallowing: Spontaneous and intermittent  Type of Nipple: Everted at rest and after stimulation  Comfort (Breast/Nipple): Soft / non-tender     Hold (Positioning): Assistance needed to correctly position infant at breast and maintain latch. Intervention(s): Breastfeeding basics reviewed;Support Pillows;Position options;Skin to skin  LATCH Score: 9  Lactation Tools Discussed/Used     Consult Status Consult Status: Follow-up Date: 12/13/14 Follow-up type: In-patient    Alfred LevinsLee, Santia Labate Anne 12/12/2014, 1:16 PM

## 2014-12-12 NOTE — Progress Notes (Signed)
Mary Cunningham [id number]  Subjective: Postpartum Day 2: [facility]. Patient up ad lib, reports no syncope or dizziness.  No flatus yet. Feeding:  Breast Contraceptive plan:  Mirena  Objective: Temp:  [98 F (36.7 C)-98.2 F (36.8 C)] 98.1 F (36.7 C) (03/20 0607) Pulse Rate:  [71-83] 71 (03/20 0607) Resp:  [18] 18 (03/20 0607) BP: (105-108)/(57-66) 105/60 mmHg (03/20 0607) SpO2:  [100 %] 100 % (03/20 0607)   Recent Labs  12/09/14 2250 12/11/14 0619  HGB 12.5 10.1*  HCT 37.9 31.3*  WBC 8.8 13.9*    Physical Exam:  General: alert Lochia: appropriate Uterine Fundus: firm Abdomen:  + bowel sounds, mild gaseous distension Incision: Honeycomb dressing CDI DVT Evaluation: No evidence of DVT seen on physical exam. Negative Homan's sign.   Assessment/Plan: Status post cesarean delivery, day 2 Stable Continue current care. Plan for discharge tomorrow  Encouraged ambulation to facilitate bowel function.    Mary Cunningham, Mary Abdulla MSN, CNM 12/12/2014, 9:22 AM

## 2014-12-13 ENCOUNTER — Encounter (HOSPITAL_COMMUNITY): Payer: Self-pay | Admitting: Obstetrics & Gynecology

## 2014-12-13 MED ORDER — IBUPROFEN 600 MG PO TABS: 600.0000 mg | ORAL_TABLET | Freq: Four times a day (QID) | ORAL | Status: DC

## 2014-12-13 MED ORDER — FERROUS SULFATE 325 (65 FE) MG PO TBEC: 325.0000 mg | DELAYED_RELEASE_TABLET | Freq: Two times a day (BID) | ORAL | Status: DC

## 2014-12-13 MED ORDER — OXYCODONE-ACETAMINOPHEN 5-325 MG PO TABS: 1.0000 | ORAL_TABLET | ORAL | Status: DC | PRN

## 2014-12-13 NOTE — Discharge Summary (Signed)
Cesarean Section Delivery Discharge Summary  Mary Cunningham  DOB:    06-Aug-1981 MRN:    [id number] CSN:    [id number]  Date of admission:                  12/09/14  Date of discharge:                   12/13/14  Procedures this admission: primary CS  Date of Delivery:  12/10/14  Newborn Data:  Live born  Information for the patient's newborn:  Mary, Cunningham [[id number]]  female   Live born female  Birth Weight: 6 lb 10 oz (3005 g) APGAR: 1, 8  Home with mother. Name: Mary Cunningham Circumcision Plan: in patient  History of Present Illness:  [name] is a 34 y.o. female, G1P1001, who presents at [redacted]w[redacted]d weeks gestation. The patient has been followed at the [facility] division of [facility].    Her pregnancy has been complicated by:  Patient Active Problem List   Diagnosis Date Noted  . Status post primary low transverse cesarean section 12/12/2014    Hospital course: The patient was admitted for labor.   Her postpartum course was not complicated.  She was discharged to home on postpartum day 3 doing well.  Feeding: breast  Contraception: IUD Pt understands the risks are but not limited to irregular bleeding, formation of DVT, fluid fluctuations, elevation in blood pressure, stroke, breast tenderness and liver damage.  She states she will report any serious side effects.  She has been given verbal and written instructions and voiced a clear understanding.    Discharge hemoglobin: HEMOGLOBIN  Date Value Ref Range Status  12/11/2014 10.1* 12.0 - 15.0 g/dL Final    Comment:    REPEATED TO VERIFY DELTA CHECK NOTED    HCT  Date Value Ref Range Status  12/11/2014 31.3* 36.0 - 46.0 % Final   Anemia - hemodynamicly stable.    PreNatal Labs ABO, Rh: --/--/A POS, A POS (03/17 2306)   Antibody: NEG (03/17 2306)  Rubella:   immune RPR: Non Reactive (03/17 2250)  HBsAg: Negative (08/07 0000)  HIV:  Non-reactive (08/07 0000)  GBS: Negative (03/18 0000)  Discharge Physical Exam:  General: alert and cooperative Lochia: appropriate Uterine Fundus: firm Incision: healing well DVT Evaluation: No evidence of DVT seen on physical exam.  Intrapartum Procedures: cesarean: low cervical, transverse Postpartum Procedures: none Complications-Operative and Postpartum: none  Discharge Diagnoses: Term Pregnancy-delivered,  asymptomatic anemia  Discharge Information:  Activity:           pelvic rest Diet:                routine Medications: None, Ibuprofen, Iron and Percocet Condition:      stable   Discharge to: home  Follow-up Information    Follow up with [facility] In 6 weeks.   Specialty:  Obstetrics and Gynecology   Why:  Postpartum check up   Contact information:   [address] [place] [address] [phone]       Adelina Mings, CNM, MSN  12/13/2014. 7:39 AM  Care After Cesarean Delivery  Refer to this sheet in the next few weeks. These instructions provide you with information on caring for yourself after your procedure. Your caregiver may also give you specific instructions. Your treatment has been planned according to current medical practices, but problems sometimes occur. Call your  caregiver if you have any problems or questions after you go home. HOME CARE INSTRUCTIONS  Only take over-the-counter or prescription medicines as directed by your caregiver.  Do not drink alcohol, especially if you are breastfeeding or taking medicine to relieve pain.  Do not chew or smoke tobacco.  Continue to use good perineal care. Good perineal care includes:  Wiping your perineum from front to back.  Keeping your perineum clean.  Check your cut (incision) daily for increased redness, drainage, swelling, or separation of skin.  Clean your incision gently with soap and water every day, and then pat it dry. If  your caregiver says it is okay, leave the incision uncovered. Use a bandage (dressing) if the incision is draining fluid or appears irritated. If the adhesive strips across the incision do not fall off within 7 days, carefully peel them off.  Hug a pillow when coughing or sneezing until your incision is healed. This helps to relieve pain.  Do not use tampons or douche until your caregiver says it is okay.  Shower, wash your hair, and take tub baths as directed by your caregiver.  Wear a well-fitting bra that provides breast support.  Limit wearing support panties or control-top hose.  Drink enough fluids to keep your urine clear or pale yellow.  Eat high-fiber foods such as whole grain cereals and breads, brown rice, beans, and fresh fruits and vegetables every day. These foods may help prevent or relieve constipation.  Resume activities such as climbing stairs, driving, lifting, exercising, or traveling as directed by your caregiver.  Talk to your caregiver about resuming sexual activities. This is dependent upon your risk of infection, your rate of healing, and your comfort and desire to resume sexual activity.  Try to have someone help you with your household activities and your newborn for at least a few days after you leave the hospital.  Rest as much as possible. Try to rest or take a nap when your newborn is sleeping.  Increase your activities gradually.  Keep all of your scheduled postpartum appointments. It is very important to keep your scheduled follow-up appointments. At these appointments, your caregiver will be checking to make sure that you are healing physically and emotionally. SEEK MEDICAL CARE IF:   You are passing large clots from your vagina. Save any clots to show your caregiver.  You have a foul smelling discharge from your vagina.  You have trouble urinating.  You are urinating frequently.  You have pain when you urinate.  You have a change in your bowel  movements.  You have increasing redness, pain, or swelling near your incision.  You have pus draining from your incision.  Your incision is separating.  You have painful, hard, or reddened breasts.  You have a severe headache.  You have blurred vision or see spots.  You feel sad or depressed.  You have thoughts of hurting yourself or your newborn.  You have questions about your care, the care of your newborn, or medicines.  You are dizzy or lightheaded.  You have a rash.  You have pain, redness, or swelling at the site of the removed intravenous access (IV) tube.  You have nausea or vomiting.  You stopped breastfeeding and have not had a menstrual period within 12 weeks of stopping.  You are not breastfeeding and have not had a menstrual period within 12 weeks of delivery.  You have a fever. SEEK IMMEDIATE MEDICAL CARE IF:  You have persistent pain.  You have chest pain.  You have shortness of breath.  You faint.  You have leg pain.  You have stomach pain.  Your vaginal bleeding saturates 2 or more sanitary pads in 1 hour. MAKE SURE YOU:   Understand these instructions.  Will watch your condition.  Will get help right away if you are not doing well or get worse. Document Released: 06/02/2002 Document Revised: 06/04/2012 Document Reviewed: 05/07/2012 [facility] Patient Information 2014 [place].   Postpartum Depression and Baby Blues  The postpartum period begins right after the birth of a baby. During this time, there is often a great amount of joy and excitement. It is also a time of considerable changes in the life of the parent(s). Regardless of how many times a mother gives birth, each child brings new challenges and dynamics to the family. It is not unusual to have feelings of excitement accompanied by confusing shifts in moods, emotions, and thoughts. All mothers are at risk of developing postpartum depression or the "baby blues." These mood  changes can occur right after giving birth, or they may occur many months after giving birth. The baby blues or postpartum depression can be mild or severe. Additionally, postpartum depression can resolve rather quickly, or it can be a long-term condition. CAUSES Elevated hormones and their rapid decline are thought to be a main cause of postpartum depression and the baby blues. There are a number of hormones that radically change during and after pregnancy. Estrogen and progesterone usually decrease immediately after delivering your baby. The level of thyroid hormone and various cortisol steroids also rapidly drop. Other factors that play a major role in these changes include major life events and genetics.  RISK FACTORS If you have any of the following risks for the baby blues or postpartum depression, know what symptoms to watch out for during the postpartum period. Risk factors that may increase the likelihood of getting the baby blues or postpartum depression include: 1. Havinga personal or family history of depression. 2. Having depression while being pregnant. 3. Having premenstrual or oral contraceptive-associated mood issues. 4. Having exceptional life stress. 5. Having marital conflict. 6. Lacking a social support network. 7. Having a baby with special needs. 8. Having health problems such as diabetes. SYMPTOMS Baby blues symptoms include:  Brief fluctuations in mood, such as going from extreme happiness to sadness.  Decreased concentration.  Difficulty sleeping.  Crying spells, tearfulness.  Irritability.  Anxiety. Postpartum depression symptoms typically begin within the first month after giving birth. These symptoms include:  Difficulty sleeping or excessive sleepiness.  Marked weight loss.  Agitation.  Feelings of worthlessness.  Lack of interest in activity or food. Postpartum psychosis is a very concerning condition and can be dangerous. Fortunately, it is rare.  Displaying any of the following symptoms is cause for immediate medical attention. Postpartum psychosis symptoms include:  Hallucinations and delusions.  Bizarre or disorganized behavior.  Confusion or disorientation. DIAGNOSIS  A diagnosis is made by an evaluation of your symptoms. There are no medical or lab tests that lead to a diagnosis, but there are various questionnaires that a caregiver may use to identify those with the baby blues, postpartum depression, or psychosis. Often times, a screening tool called the [place] Postnatal Depression Scale is used to diagnose depression in the postpartum period.  TREATMENT The baby blues usually goes away on its own in 1 to 2 weeks. Social support is often all that is needed. You should be encouraged to get adequate sleep  and rest. Occasionally, you may be given medicines to help you sleep.  Postpartum depression requires treatment as it can last several months or longer if it is not treated. Treatment may include individual or group therapy, medicine, or both to address any social, physiological, and psychological factors that may play a role in the depression. Regular exercise, a healthy diet, rest, and social support may also be strongly recommended.  Postpartum psychosis is more serious and needs treatment right away. Hospitalization is often needed. HOME CARE INSTRUCTIONS  Get as much rest as you can. Nap when the baby sleeps.  Exercise regularly. Some women find yoga and walking to be beneficial.  Eat a balanced and nourishing diet.  Do little things that you enjoy. Have a cup of tea, take a bubble bath, read your favorite magazine, or listen to your favorite music.  Avoid alcohol.  Ask for help with household chores, cooking, grocery shopping, or running errands as needed. Do not try to do everything.  Talk to people close to you about how you are feeling. Get support from your partner, family members, friends, or other new  moms.  Try to stay positive in how you think. Think about the things you are grateful for.  Do not spend a lot of time alone.  Only take medicine as directed by your caregiver.  Keep all your postpartum appointments.  Let your caregiver know if you have any concerns. SEEK MEDICAL CARE IF: You are having a reaction or problems with your medicine. SEEK IMMEDIATE MEDICAL CARE IF:  You have suicidal feelings.  You feel you may harm the baby or someone else. Document Released: 06/14/2004 Document Revised: 12/03/2011 Document Reviewed: 07/17/2011 [facility] Patient Information 2014 [place], [place].   Breastfeeding Deciding to breastfeed is one of the best choices you can make for you and your baby. A change in hormones during pregnancy causes your breast tissue to grow and increases the number and size of your milk ducts. These hormones also allow proteins, sugars, and fats from your blood supply to make breast milk in your milk-producing glands. Hormones prevent breast milk from being released before your baby is born as well as prompt milk flow after birth. Once breastfeeding has begun, thoughts of your baby, as well as his or her sucking or crying, can stimulate the release of milk from your milk-producing glands.  BENEFITS OF BREASTFEEDING For Your Baby  Your first milk (colostrum) helps your baby's digestive system function better.   There are antibodies in your milk that help your baby fight off infections.   Your baby has a lower incidence of asthma, allergies, and sudden infant death syndrome.   The nutrients in breast milk are better for your baby than infant formulas and are designed uniquely for your baby's needs.   Breast milk improves your baby's brain development.   Your baby is less likely to develop other conditions, such as childhood obesity, asthma, or type 2 diabetes mellitus.  For You   Breastfeeding helps to create a very special bond between you and your  baby.   Breastfeeding is convenient. Breast milk is always available at the correct temperature and costs nothing.   Breastfeeding helps to burn calories and helps you lose the weight gained during pregnancy.   Breastfeeding makes your uterus contract to its prepregnancy size faster and slows bleeding (lochia) after you give birth.   Breastfeeding helps to lower your risk of developing type 2 diabetes mellitus, osteoporosis, and breast or ovarian cancer  later in life. SIGNS THAT YOUR BABY IS HUNGRY Early Signs of Hunger  Increased alertness or activity.  Stretching.  Movement of the head from side to side.  Movement of the head and opening of the mouth when the corner of the mouth or cheek is stroked (rooting).  Increased sucking sounds, smacking lips, cooing, sighing, or squeaking.  Hand-to-mouth movements.  Increased sucking of fingers or hands. Late Signs of Hunger  Fussing.  Intermittent crying. Extreme Signs of Hunger Signs of extreme hunger will require calming and consoling before your baby will be able to breastfeed successfully. Do not wait for the following signs of extreme hunger to occur before you initiate breastfeeding:   Restlessness.  A loud, strong cry.   Screaming.  BREASTFEEDING BASICS Breastfeeding Initiation  Find a comfortable place to sit or lie down, with your neck and back well supported.  Place a pillow or rolled up blanket under your baby to bring him or her to the level of your breast (if you are seated). Nursing pillows are specially designed to help support your arms and your baby while you breastfeed.  Make sure that your baby's abdomen is facing your abdomen.   Gently massage your breast. With your fingertips, massage from your chest wall toward your nipple in a circular motion. This encourages milk flow. You may need to continue this action during the feeding if your milk flows slowly.  Support your breast with 4 fingers  underneath and your thumb above your nipple. Make sure your fingers are well away from your nipple and your baby's mouth.   Stroke your baby's lips gently with your finger or nipple.   When your baby's mouth is open wide enough, quickly bring your baby to your breast, placing your entire nipple and as much of the colored area around your nipple (areola) as possible into your baby's mouth.   More areola should be visible above your baby's upper lip than below the lower lip.   Your baby's tongue should be between his or her lower gum and your breast.   Ensure that your baby's mouth is correctly positioned around your nipple (latched). Your baby's lips should create a seal on your breast and be turned out (everted).  It is common for your baby to suck about 2-3 minutes in order to start the flow of breast milk. Latching Teaching your baby how to latch on to your breast properly is very important. An improper latch can cause nipple pain and decreased milk supply for you and poor weight gain in your baby. Also, if your baby is not latched onto your nipple properly, he or she may swallow some air during feeding. This can make your baby fussy. Burping your baby when you switch breasts during the feeding can help to get rid of the air. However, teaching your baby to latch on properly is still the best way to prevent fussiness from swallowing air while breastfeeding. Signs that your baby has successfully latched on to your nipple:    Silent tugging or silent sucking, without causing you pain.   Swallowing heard between every 3-4 sucks.    Muscle movement above and in front of his or her ears while sucking.  Signs that your baby has not successfully latched on to nipple:   Sucking sounds or smacking sounds from your baby while breastfeeding.  Nipple pain. If you think your baby has not latched on correctly, slip your finger into the corner of your baby's mouth to  break the suction and place  it between your baby's gums. Attempt breastfeeding initiation again. Signs of Successful Breastfeeding Signs from your baby:   A gradual decrease in the number of sucks or complete cessation of sucking.   Falling asleep.   Relaxation of his or her body.   Retention of a small amount of milk in his or her mouth.   Letting go of your breast by himself or herself. Signs from you:  Breasts that have increased in firmness, weight, and size 1-3 hours after feeding.   Breasts that are softer immediately after breastfeeding.  Increased milk volume, as well as a change in milk consistency and color by the fifth day of breastfeeding.   Nipples that are not sore, cracked, or bleeding. Signs That Your Pecola Leisure is Getting Enough Milk  Wetting at least 3 diapers in a 24-hour period. The urine should be clear and pale yellow by age 210 days.  At least 3 stools in a 24-hour period by age 210 days. The stool should be soft and yellow.  At least 3 stools in a 24-hour period by age 695 days. The stool should be seedy and yellow.  No loss of weight greater than 10% of birth weight during the first 23 days of age.  Average weight gain of 4-7 ounces (113-198 g) per week after age 21 days.  Consistent daily weight gain by age 210 days, without weight loss after the age of 2 weeks. After a feeding, your baby may spit up a small amount. This is common. BREASTFEEDING FREQUENCY AND DURATION Frequent feeding will help you make more milk and can prevent sore nipples and breast engorgement. Breastfeed when you feel the need to reduce the fullness of your breasts or when your baby shows signs of hunger. This is called "breastfeeding on demand." Avoid introducing a pacifier to your baby while you are working to establish breastfeeding (the first 4-6 weeks after your baby is born). After this time you may choose to use a pacifier. Research has shown that pacifier use during the first year of a baby's life decreases the  risk of sudden infant death syndrome (SIDS). Allow your baby to feed on each breast as long as he or she wants. Breastfeed until your baby is finished feeding. When your baby unlatches or falls asleep while feeding from the first breast, offer the second breast. Because newborns are often sleepy in the first few weeks of life, you may need to awaken your baby to get him or her to feed. Breastfeeding times will vary from baby to baby. However, the following rules can serve as a guide to help you ensure that your baby is properly fed:  Newborns (babies 40 weeks of age or younger) may breastfeed every 1-3 hours.  Newborns should not go longer than 3 hours during the day or 5 hours during the night without breastfeeding.  You should breastfeed your baby a minimum of 8 times in a 24-hour period until you begin to introduce solid foods to your baby at around 82 months of age. BREAST MILK PUMPING Pumping and storing breast milk allows you to ensure that your baby is exclusively fed your breast milk, even at times when you are unable to breastfeed. This is especially important if you are going back to work while you are still breastfeeding or when you are not able to be present during feedings. Your lactation consultant can give you guidelines on how long it is safe to store breast  milk.  A breast pump is a machine that allows you to pump milk from your breast into a sterile bottle. The pumped breast milk can then be stored in a refrigerator or freezer. Some breast pumps are operated by hand, while others use electricity. Ask your lactation consultant which type will work best for you. Breast pumps can be purchased, but some hospitals and breastfeeding support groups lease breast pumps on a monthly basis. A lactation consultant can teach you how to hand express breast milk, if you prefer not to use a pump.  CARING FOR YOUR BREASTS WHILE YOU BREASTFEED Nipples can become dry, cracked, and sore while breastfeeding.  The following recommendations can help keep your breasts moisturized and healthy:  Avoid using soap on your nipples.   Wear a supportive bra. Although not required, special nursing bras and tank tops are designed to allow access to your breasts for breastfeeding without taking off your entire bra or top. Avoid wearing underwire-style bras or extremely tight bras.  Air dry your nipples for 3-64minutes after each feeding.   Use only cotton bra pads to absorb leaked breast milk. Leaking of breast milk between feedings is normal.   Use lanolin on your nipples after breastfeeding. Lanolin helps to maintain your skin's normal moisture barrier. If you use pure lanolin, you do not need to wash it off before feeding your baby again. Pure lanolin is not toxic to your baby. You may also hand express a few drops of breast milk and gently massage that milk into your nipples and allow the milk to air dry. In the first few weeks after giving birth, some women experience extremely full breasts (engorgement). Engorgement can make your breasts feel heavy, warm, and tender to the touch. Engorgement peaks within 3-5 days after you give birth. The following recommendations can help ease engorgement:  Completely empty your breasts while breastfeeding or pumping. You may want to start by applying warm, moist heat (in the shower or with warm water-soaked hand towels) just before feeding or pumping. This increases circulation and helps the milk flow. If your baby does not completely empty your breasts while breastfeeding, pump any extra milk after he or she is finished.  Wear a snug bra (nursing or regular) or tank top for 1-2 days to signal your body to slightly decrease milk production.  Apply ice packs to your breasts, unless this is too uncomfortable for you.  Make sure that your baby is latched on and positioned properly while breastfeeding. If engorgement persists after 48 hours of following these  recommendations, contact your health care provider or a Advertising copywriter. OVERALL HEALTH CARE RECOMMENDATIONS WHILE BREASTFEEDING  Eat healthy foods. Alternate between meals and snacks, eating 3 of each per day. Because what you eat affects your breast milk, some of the foods may make your baby more irritable than usual. Avoid eating these foods if you are sure that they are negatively affecting your baby.  Drink milk, fruit juice, and water to satisfy your thirst (about 10 glasses a day).   Rest often, relax, and continue to take your prenatal vitamins to prevent fatigue, stress, and anemia.  Continue breast self-awareness checks.  Avoid chewing and smoking tobacco.  Avoid alcohol and drug use. Some medicines that may be harmful to your baby can pass through breast milk. It is important to ask your health care provider before taking any medicine, including all over-the-counter and prescription medicine as well as vitamin and herbal supplements. It is possible to  become pregnant while breastfeeding. If birth control is desired, ask your health care provider about options that will be safe for your baby. SEEK MEDICAL CARE IF:   You feel like you want to stop breastfeeding or have become frustrated with breastfeeding.  You have painful breasts or nipples.  Your nipples are cracked or bleeding.  Your breasts are red, tender, or warm.  You have a swollen area on either breast.  You have a fever or chills.  You have nausea or vomiting.  You have drainage other than breast milk from your nipples.  Your breasts do not become full before feedings by the fifth day after you give birth.  You feel sad and depressed.  Your baby is too sleepy to eat well.  Your baby is having trouble sleeping.   Your baby is wetting less than 3 diapers in a 24-hour period.  Your baby has less than 3 stools in a 24-hour period.  Your baby's skin or the white part of his or her eyes becomes  yellow.   Your baby is not gaining weight by 50 days of age. SEEK IMMEDIATE MEDICAL CARE IF:   Your baby is overly tired (lethargic) and does not want to wake up and feed.  Your baby develops an unexplained fever. Document Released: 09/10/2005 Document Revised: 09/15/2013 Document Reviewed: 03/04/2013 [facility] Patient Information 2015 [place], [place]. This information is not intended to replace advice given to you by your health care provider. Make sure you discuss any questions you have with your health care provider.

## 2014-12-13 NOTE — Lactation Note (Signed)
This note was copied from the chart of Mary Cunningham. Lactation Consultation Note  Patient Name: Mary Cunningham [id number]'XToday's Date: 12/13/2014 Reason for consult: Follow-up assessment Baby 72 hours of life. Mom is pumping after each feeding and is getting over 1.5 ounces of transitional milk. Mom is eating breakfast and baby sleeping in crib. Mom not wanting to latch right now. Discussed with mom that she can focus on nursing first, and then pump or hand express for comfort. Enc mom to start with NS on right nipple, and then as milk begins to flow and nipple is everted, she can remove NS and attempt to latch baby directly to breast. Mom given addition #20 NS, and is aware of OP/BFSG and LC phone line assistance after D/C. Mom referred to Baby and Me booklet for EBM storage guidelines and number of diapers to expect by day of life. Discussed nursing/pumping and returning to work. Mom has personal DEBP. Enc mom to call for assistance as needed, especially with latching.   Maternal Data Has patient been taught Hand Expression?: Yes (Per mom.)  Feeding Feeding Type: Breast Fed Length of feed: 20 min  LATCH Score/Interventions                      Lactation Tools Discussed/Used     Consult Status Consult Status: Complete    Mary Cunningham 12/13/2014, 9:11 AM

## 2016-02-06 ENCOUNTER — Telehealth: Payer: Self-pay | Admitting: *Deleted

## 2016-02-06 ENCOUNTER — Encounter: Payer: Self-pay | Admitting: *Deleted

## 2016-02-06 NOTE — Telephone Encounter (Signed)
Pre-Visit Call completed with patient and chart updated.   Pre-Visit Info documented in Specialty Comments under [employer].    

## 2016-02-07 ENCOUNTER — Other Ambulatory Visit: Payer: 59

## 2016-02-07 ENCOUNTER — Encounter: Payer: Self-pay | Admitting: Family Medicine

## 2016-02-07 ENCOUNTER — Ambulatory Visit (INDEPENDENT_AMBULATORY_CARE_PROVIDER_SITE_OTHER): Payer: 59 | Admitting: Family Medicine

## 2016-02-07 VITALS — BP 114/68 | HR 110 | Temp 98.5°F | Ht 61.0 in | Wt 186.0 lb

## 2016-02-07 DIAGNOSIS — K589 Irritable bowel syndrome without diarrhea: Secondary | ICD-10-CM | POA: Insufficient documentation

## 2016-02-07 DIAGNOSIS — Z0000 Encounter for general adult medical examination without abnormal findings: Secondary | ICD-10-CM

## 2016-02-07 HISTORY — DX: Encounter for general adult medical examination without abnormal findings: Z00.00

## 2016-02-07 LAB — LIPID PANEL
CHOLESTEROL: 166 mg/dL (ref 0–200)
HDL: 56 mg/dL (ref >39.00)
LDL Cholesterol: 101 mg/dL — ABNORMAL HIGH (ref 0–99)
NonHDL: 109.73
Total CHOL/HDL Ratio: 3
Triglycerides: 46 mg/dL (ref 0.0–149.0)
VLDL: 9.2 mg/dL (ref 0.0–40.0)

## 2016-02-07 LAB — COMPREHENSIVE METABOLIC PANEL
ALK PHOS: 85 U/L (ref 39–117)
ALT: 9 U/L (ref 0–35)
AST: 15 U/L (ref 0–37)
Albumin: 4.5 g/dL (ref 3.5–5.2)
BILIRUBIN TOTAL: 0.5 mg/dL (ref 0.2–1.2)
BUN: 15 mg/dL (ref 6–23)
CO2: 29 mEq/L (ref 19–32)
Calcium: 10.1 mg/dL (ref 8.4–10.5)
Chloride: 105 mEq/L (ref 96–112)
Creatinine, Ser: 0.92 mg/dL (ref 0.40–1.20)
GFR: 90.55 mL/min (ref >60.00)
Glucose, Bld: 88 mg/dL (ref 70–99)
Potassium: 5.3 mEq/L — ABNORMAL HIGH (ref 3.5–5.1)
Sodium: 141 mEq/L (ref 135–145)
TOTAL PROTEIN: 7.6 g/dL (ref 6.0–8.3)

## 2016-02-07 LAB — CBC
HCT: 42.8 % (ref 36.0–46.0)
Hemoglobin: 14 g/dL (ref 12.0–15.0)
MCHC: 32.8 g/dL (ref 30.0–36.0)
MCV: 88.4 fl (ref 78.0–100.0)
Platelets: 324 10*3/uL (ref 150.0–400.0)
RBC: 4.84 Mil/uL (ref 3.87–5.11)
RDW: 13.8 % (ref 11.5–15.5)
WBC: 7.1 10*3/uL (ref 4.0–10.5)

## 2016-02-07 LAB — TSH: TSH: 1.24 u[IU]/mL (ref 0.35–4.50)

## 2016-02-07 NOTE — Progress Notes (Signed)
Subjective:    Patient ID: Mary Cunningham, female    DOB: December 30, 1980, 35 y.o.   MRN: [id number]  Chief Complaint  Patient presents with  . Establish Care    HPI Patient is in today for establish care as new patient. Patient reports she has some feeing of anxiety and does feel like there is some since of palpations with the anxiety.  Patient also reports some issues with lack of sleep due to 13 month child waking at night and still is currently breastfeeding.  Patient has had a childhood history of heart murmurs.  Denies CP/palp/SOB/HA/congestion/fevers/GI or GU c/o. Taking meds as prescribed.   Past Medical History  Diagnosis Date  . Irritable bowel syndrome (IBS)   . Status post primary low transverse cesarean section 12/12/2014  . Heart murmur     "as a child"  . Preventative health care 02/07/2016    Past Surgical History  Procedure Laterality Date  . Wisdom tooth extraction    . Cesarean section N/A 12/10/2014    Procedure: CESAREAN SECTION;  Surgeon: Hoover Browns, MD;  Location: [facility];  Service: Obstetrics;  Laterality: N/A;    Family History  Problem Relation Age of Onset  . Hypertension Mother   . Asthma Mother   . GER disease Father   . Sleep apnea Father   . Hypertension Maternal Aunt   . Sarcoidosis Maternal Aunt   . Lung cancer Maternal Aunt   . Stroke Maternal Aunt   . Heart failure Maternal Grandmother   . Hypertension Maternal Grandmother   . Breast cancer Maternal Grandmother   . Endometrial cancer Maternal Grandmother   . Stroke Maternal Grandmother   . Dementia Maternal Grandmother   . Cancer Maternal Grandmother   . Lung cancer Maternal Grandfather   . Dementia Paternal Grandmother   . Healthy Paternal Grandfather     Social History   Social History  . Marital Status: Single    Spouse Name: N/A  . Number of Children: N/A  . Years of Education: N/A   Occupational History  . RN     Social History Main Topics  . Smoking status: Never  Smoker   . Smokeless tobacco: Never Used  . Alcohol Use: No  . Drug Use: No  . Sexual Activity: Yes    Birth Control/ Protection: IUD   Other Topics Concern  . Not on file   Social History Narrative   Lives with husband and son   No major dietary restrictions   Works at [employer]     Outpatient Prescriptions Prior to Visit  Medication Sig Dispense Refill  . Levonorgestrel (SKYLA) 13.5 MG IUD by Intrauterine route.    . ferrous sulfate 325 (65 FE) MG EC tablet Take 1 tablet (325 mg total) by mouth 2 (two) times daily. 60 tablet 3   No facility-administered medications prior to visit.    No Known Allergies  Review of Systems  Constitutional: Negative for fever and malaise/fatigue.  HENT: Negative for congestion.   Eyes: Negative for blurred vision.  Respiratory: Negative for shortness of breath.   Cardiovascular: Negative for chest pain, palpitations and leg swelling.  Gastrointestinal: Negative for nausea, abdominal pain and blood in stool.  Genitourinary: Negative for dysuria and frequency.  Musculoskeletal: Negative for falls.  Skin: Negative for rash.  Neurological: Negative for dizziness, loss of consciousness and headaches.  Endo/Heme/Allergies: Negative for environmental allergies.  Psychiatric/Behavioral: Negative for depression. The patient has insomnia. The patient is not nervous/anxious.  Objective:    Physical Exam  Constitutional: She is oriented to person, place, and time. She appears well-developed and well-nourished. No distress.  HENT:  Head: Normocephalic and atraumatic.  Eyes: Conjunctivae are normal.  Neck: Neck supple. No thyromegaly present.  Cardiovascular: Normal rate, regular rhythm and normal heart sounds.   No murmur heard. Pulmonary/Chest: Effort normal and breath sounds normal. No respiratory distress.  Abdominal: Soft. Bowel sounds are normal. She exhibits no distension and no mass. There is no tenderness.  Musculoskeletal: She  exhibits no edema.  Lymphadenopathy:    She has no cervical adenopathy.  Neurological: She is alert and oriented to person, place, and time.  Skin: Skin is warm and dry.  Psychiatric: She has a normal mood and affect. Her behavior is normal.    BP 114/68 mmHg  Pulse 110  Temp(Src) 98.5 F (36.9 C) (Oral)  Ht 5\' 1"  (1.549 m)  Wt 186 lb (84.369 kg)  BMI 35.16 kg/m2  SpO2 98%  LMP 01/25/2016  Breastfeeding? Yes Wt Readings from Last 3 Encounters:  02/07/16 186 lb (84.369 kg)  12/09/14 214 lb (97.07 kg)     Lab Results  Component Value Date   WBC 7.1 02/07/2016   HGB 14.0 02/07/2016   HCT 42.8 02/07/2016   PLT 324.0 02/07/2016   GLUCOSE 88 02/07/2016   CHOL 166 02/07/2016   TRIG 46.0 02/07/2016   HDL 56.00 02/07/2016   LDLCALC 101* 02/07/2016   ALT 9 02/07/2016   AST 15 02/07/2016   NA 141 02/07/2016   K 5.3* 02/07/2016   CL 105 02/07/2016   CREATININE 0.92 02/07/2016   BUN 15 02/07/2016   CO2 29 02/07/2016   TSH 1.24 02/07/2016    Lab Results  Component Value Date   TSH 1.24 02/07/2016   Lab Results  Component Value Date   WBC 7.1 02/07/2016   HGB 14.0 02/07/2016   HCT 42.8 02/07/2016   MCV 88.4 02/07/2016   PLT 324.0 02/07/2016   Lab Results  Component Value Date   NA 141 02/07/2016   K 5.3* 02/07/2016   CO2 29 02/07/2016   GLUCOSE 88 02/07/2016   BUN 15 02/07/2016   CREATININE 0.92 02/07/2016   BILITOT 0.5 02/07/2016   ALKPHOS 85 02/07/2016   AST 15 02/07/2016   ALT 9 02/07/2016   PROT 7.6 02/07/2016   ALBUMIN 4.5 02/07/2016   CALCIUM 10.1 02/07/2016   GFR 90.55 02/07/2016   Lab Results  Component Value Date   CHOL 166 02/07/2016   Lab Results  Component Value Date   HDL 56.00 02/07/2016   Lab Results  Component Value Date   LDLCALC 101* 02/07/2016   Lab Results  Component Value Date   TRIG 46.0 02/07/2016   Lab Results  Component Value Date   CHOLHDL 3 02/07/2016   No results found for: HGBA1C     Assessment & Plan:    Problem List Items Addressed This Visit    Preventative health care - Primary    Patient encouraged to maintain heart healthy diet, regular exercise, adequate sleep. Consider daily probiotics. Take medications as prescribed. Given and reviewed copy of ACP documents from [facility] Secretary of State and encouraged to complete and return      Relevant Orders   CBC (Completed)   TSH (Completed)   Lipid panel (Completed)   Comprehensive metabolic panel (Completed)      I am having Ms. Mackel maintain her ferrous sulfate and Levonorgestrel.  No orders of the  defined types were placed in this encounter.     Danise EdgeBLYTH, Sy Saintjean, MD

## 2016-02-07 NOTE — Patient Instructions (Addendum)
Try melatonin at bedtime.  NOW Probiotic Vitamin. Available at [facility] Release of records employee health shot records Preventive Care for Adults, Female A healthy lifestyle and preventive care can promote health and wellness. Preventive health guidelines for women include the following key practices.  A routine yearly physical is a good way to check with your health care provider about your health and preventive screening. It is a chance to share any concerns and updates on your health and to receive a thorough exam.  Visit your dentist for a routine exam and preventive care every 6 months. Brush your teeth twice a day and floss once a day. Good oral hygiene prevents tooth decay and gum disease.  The frequency of eye exams is based on your age, health, family medical history, use of contact lenses, and other factors. Follow your health care provider's recommendations for frequency of eye exams.  Eat a healthy diet. Foods like vegetables, fruits, whole grains, low-fat dairy products, and lean protein foods contain the nutrients you need without too many calories. Decrease your intake of foods high in solid fats, added sugars, and salt. Eat the right amount of calories for you.Get information about a proper diet from your health care provider, if necessary.  Regular physical exercise is one of the most important things you can do for your health. Most adults should get at least 150 minutes of moderate-intensity exercise (any activity that increases your heart rate and causes you to sweat) each week. In addition, most adults need muscle-strengthening exercises on 2 or more days a week.  Maintain a healthy weight. The body mass index (BMI) is a screening tool to identify possible weight problems. It provides an estimate of body fat based on height and weight. Your health care provider can find your BMI and can help you achieve or maintain a healthy weight.For adults 20 years and older:  A  BMI below 18.5 is considered underweight.  A BMI of 18.5 to 24.9 is normal.  A BMI of 25 to 29.9 is considered overweight.  A BMI of 30 and above is considered obese.  Maintain normal blood lipids and cholesterol levels by exercising and minimizing your intake of saturated fat. Eat a balanced diet with plenty of fruit and vegetables. Blood tests for lipids and cholesterol should begin at age 20 and be repeated every 5 years. If your lipid or cholesterol levels are high, you are over 50, or you are at high risk for heart disease, you may need your cholesterol levels checked more frequently.Ongoing high lipid and cholesterol levels should be treated with medicines if diet and exercise are not working.  If you smoke, find out from your health care provider how to quit. If you do not use tobacco, do not start.  Lung cancer screening is recommended for adults aged 55-80 years who are at high risk for developing lung cancer because of a history of smoking. A yearly low-dose CT scan of the lungs is recommended for people who have at least a 30-pack-year history of smoking and are a current smoker or have quit within the past 15 years. A pack year of smoking is smoking an average of 1 pack of cigarettes a day for 1 year (for example: 1 pack a day for 30 years or 2 packs a day for 15 years). Yearly screening should continue until the smoker has stopped smoking for at least 15 years. Yearly screening should be stopped for people who develop a health problem that would   prevent them from having lung cancer treatment.  If you are pregnant, do not drink alcohol. If you are breastfeeding, be very cautious about drinking alcohol. If you are not pregnant and choose to drink alcohol, do not have more than 1 drink per day. One drink is considered to be 12 ounces (355 mL) of beer, 5 ounces (148 mL) of wine, or 1.5 ounces (44 mL) of liquor.  Avoid use of street drugs. Do not share needles with anyone. Ask for help if  you need support or instructions about stopping the use of drugs.  High blood pressure causes heart disease and increases the risk of stroke. Your blood pressure should be checked at least every 1 to 2 years. Ongoing high blood pressure should be treated with medicines if weight loss and exercise do not work.  If you are 55-79 years old, ask your health care provider if you should take aspirin to prevent strokes.  Diabetes screening is done by taking a blood sample to check your blood glucose level after you have not eaten for a certain period of time (fasting). If you are not overweight and you do not have risk factors for diabetes, you should be screened once every 3 years starting at age 45. If you are overweight or obese and you are 40-70 years of age, you should be screened for diabetes every year as part of your cardiovascular risk assessment.  Breast cancer screening is essential preventive care for women. You should practice "breast self-awareness." This means understanding the normal appearance and feel of your breasts and may include breast self-examination. Any changes detected, no matter how small, should be reported to a health care provider. Women in their 20s and 30s should have a clinical breast exam (CBE) by a health care provider as part of a regular health exam every 1 to 3 years. After age 40, women should have a CBE every year. Starting at age 40, women should consider having a mammogram (breast X-ray test) every year. Women who have a family history of breast cancer should talk to their health care provider about genetic screening. Women at a high risk of breast cancer should talk to their health care providers about having an MRI and a mammogram every year.  Breast cancer gene (BRCA)-related cancer risk assessment is recommended for women who have family members with BRCA-related cancers. BRCA-related cancers include breast, ovarian, tubal, and peritoneal cancers. Having family  members with these cancers may be associated with an increased risk for harmful changes (mutations) in the breast cancer genes BRCA1 and BRCA2. Results of the assessment will determine the need for genetic counseling and BRCA1 and BRCA2 testing.  Your health care provider may recommend that you be screened regularly for cancer of the pelvic organs (ovaries, uterus, and vagina). This screening involves a pelvic examination, including checking for microscopic changes to the surface of your cervix (Pap test). You may be encouraged to have this screening done every 3 years, beginning at age 21.  For women ages 30-65, health care providers may recommend pelvic exams and Pap testing every 3 years, or they may recommend the Pap and pelvic exam, combined with testing for human papilloma virus (HPV), every 5 years. Some types of HPV increase your risk of cervical cancer. Testing for HPV may also be done on women of any age with unclear Pap test results.  Other health care providers may not recommend any screening for nonpregnant women who are considered low risk for pelvic cancer   and who do not have symptoms. Ask your health care provider if a screening pelvic exam is right for you.  If you have had past treatment for cervical cancer or a condition that could lead to cancer, you need Pap tests and screening for cancer for at least 20 years after your treatment. If Pap tests have been discontinued, your risk factors (such as having a new sexual partner) need to be reassessed to determine if screening should resume. Some women have medical problems that increase the chance of getting cervical cancer. In these cases, your health care provider may recommend more frequent screening and Pap tests.  Colorectal cancer can be detected and often prevented. Most routine colorectal cancer screening begins at the age of 71 years and continues through age 25 years. However, your health care provider may recommend screening at an  earlier age if you have risk factors for colon cancer. On a yearly basis, your health care provider may provide home test kits to check for hidden blood in the stool. Use of a small camera at the end of a tube, to directly examine the colon (sigmoidoscopy or colonoscopy), can detect the earliest forms of colorectal cancer. Talk to your health care provider about this at age 51, when routine screening begins. Direct exam of the colon should be repeated every 5-10 years through age 31 years, unless early forms of precancerous polyps or small growths are found.  People who are at an increased risk for hepatitis B should be screened for this virus. You are considered at high risk for hepatitis B if:  You were born in a country where hepatitis B occurs often. Talk with your health care provider about which countries are considered high risk.  Your parents were born in a high-risk country and you have not received a shot to protect against hepatitis B (hepatitis B vaccine).  You have HIV or AIDS.  You use needles to inject street drugs.  You live with, or have sex with, someone who has hepatitis B.  You get hemodialysis treatment.  You take certain medicines for conditions like cancer, organ transplantation, and autoimmune conditions.  Hepatitis C blood testing is recommended for all people born from [birth year] through 1965 and any individual with known risks for hepatitis C.  Practice safe sex. Use condoms and avoid high-risk sexual practices to reduce the spread of sexually transmitted infections (STIs). STIs include gonorrhea, chlamydia, syphilis, trichomonas, herpes, HPV, and human immunodeficiency virus (HIV). Herpes, HIV, and HPV are viral illnesses that have no cure. They can result in disability, cancer, and death.  You should be screened for sexually transmitted illnesses (STIs) including gonorrhea and chlamydia if:  You are sexually active and are younger than 24 years.  You are older than  24 years and your health care provider tells you that you are at risk for this type of infection.  Your sexual activity has changed since you were last screened and you are at an increased risk for chlamydia or gonorrhea. Ask your health care provider if you are at risk.  If you are at risk of being infected with HIV, it is recommended that you take a prescription medicine daily to prevent HIV infection. This is called preexposure prophylaxis (PrEP). You are considered at risk if:  You are sexually active and do not regularly use condoms or know the HIV status of your partner(s).  You take drugs by injection.  You are sexually active with a partner who has HIV.  Talk with your health care provider about whether you are at high risk of being infected with HIV. If you choose to begin PrEP, you should first be tested for HIV. You should then be tested every 3 months for as long as you are taking PrEP.  Osteoporosis is a disease in which the bones lose minerals and strength with aging. This can result in serious bone fractures or breaks. The risk of osteoporosis can be identified using a bone density scan. Women ages 65 years and over and women at risk for fractures or osteoporosis should discuss screening with their health care providers. Ask your health care provider whether you should take a calcium supplement or vitamin D to reduce the rate of osteoporosis.  Menopause can be associated with physical symptoms and risks. Hormone replacement therapy is available to decrease symptoms and risks. You should talk to your health care provider about whether hormone replacement therapy is right for you.  Use sunscreen. Apply sunscreen liberally and repeatedly throughout the day. You should seek shade when your shadow is shorter than you. Protect yourself by wearing long sleeves, pants, a wide-brimmed hat, and sunglasses year round, whenever you are outdoors.  Once a month, do a whole body skin exam, using  a mirror to look at the skin on your back. Tell your health care provider of new moles, moles that have irregular borders, moles that are larger than a pencil eraser, or moles that have changed in shape or color.  Stay current with required vaccines (immunizations).  Influenza vaccine. All adults should be immunized every year.  Tetanus, diphtheria, and acellular pertussis (Td, Tdap) vaccine. Pregnant women should receive 1 dose of Tdap vaccine during each pregnancy. The dose should be obtained regardless of the length of time since the last dose. Immunization is preferred during the 27th-36th week of gestation. An adult who has not previously received Tdap or who does not know her vaccine status should receive 1 dose of Tdap. This initial dose should be followed by tetanus and diphtheria toxoids (Td) booster doses every 10 years. Adults with an unknown or incomplete history of completing a 3-dose immunization series with Td-containing vaccines should begin or complete a primary immunization series including a Tdap dose. Adults should receive a Td booster every 10 years.  Varicella vaccine. An adult without evidence of immunity to varicella should receive 2 doses or a second dose if she has previously received 1 dose. Pregnant females who do not have evidence of immunity should receive the first dose after pregnancy. This first dose should be obtained before leaving the health care facility. The second dose should be obtained 4-8 weeks after the first dose.  Human papillomavirus (HPV) vaccine. Females aged 13-26 years who have not received the vaccine previously should obtain the 3-dose series. The vaccine is not recommended for use in pregnant females. However, pregnancy testing is not needed before receiving a dose. If a female is found to be pregnant after receiving a dose, no treatment is needed. In that case, the remaining doses should be delayed until after the pregnancy. Immunization is recommended  for any person with an immunocompromised condition through the age of 26 years if she did not get any or all doses earlier. During the 3-dose series, the second dose should be obtained 4-8 weeks after the first dose. The third dose should be obtained 24 weeks after the first dose and 16 weeks after the second dose.  Zoster vaccine. One dose is recommended for adults   aged 55 years or older unless certain conditions are present.  Measles, mumps, and rubella (MMR) vaccine. Adults born before [birth year] generally are considered immune to measles and mumps. Adults born in [birth year] or later should have 1 or more doses of MMR vaccine unless there is a contraindication to the vaccine or there is laboratory evidence of immunity to each of the three diseases. A routine second dose of MMR vaccine should be obtained at least 28 days after the first dose for students attending postsecondary schools, health care workers, or international travelers. People who received inactivated measles vaccine or an unknown type of measles vaccine during 1963-1967 should receive 2 doses of MMR vaccine. People who received inactivated mumps vaccine or an unknown type of mumps vaccine before 1979 and are at high risk for mumps infection should consider immunization with 2 doses of MMR vaccine. For females of childbearing age, rubella immunity should be determined. If there is no evidence of immunity, females who are not pregnant should be vaccinated. If there is no evidence of immunity, females who are pregnant should delay immunization until after pregnancy. Unvaccinated health care workers born before [birth year] who lack laboratory evidence of measles, mumps, or rubella immunity or laboratory confirmation of disease should consider measles and mumps immunization with 2 doses of MMR vaccine or rubella immunization with 1 dose of MMR vaccine.  Pneumococcal 13-valent conjugate (PCV13) vaccine. When indicated, a person who is uncertain of his immunization  history and has no record of immunization should receive the PCV13 vaccine. All adults 74 years of age and older should receive this vaccine. An adult aged 74 years or older who has certain medical conditions and has not been previously immunized should receive 1 dose of PCV13 vaccine. This PCV13 should be followed with a dose of pneumococcal polysaccharide (PPSV23) vaccine. Adults who are at high risk for pneumococcal disease should obtain the PPSV23 vaccine at least 8 weeks after the dose of PCV13 vaccine. Adults older than 33 years of age who have normal immune system function should obtain the PPSV23 vaccine dose at least 1 year after the dose of PCV13 vaccine.  Pneumococcal polysaccharide (PPSV23) vaccine. When PCV13 is also indicated, PCV13 should be obtained first. All adults aged 23 years and older should be immunized. An adult younger than age 2 years who has certain medical conditions should be immunized. Any person who resides in a nursing home or long-term care facility should be immunized. An adult smoker should be immunized. People with an immunocompromised condition and certain other conditions should receive both PCV13 and PPSV23 vaccines. People with human immunodeficiency virus (HIV) infection should be immunized as soon as possible after diagnosis. Immunization during chemotherapy or radiation therapy should be avoided. Routine use of PPSV23 vaccine is not recommended for American Indians, [place] Natives, or people younger than 65 years unless there are medical conditions that require PPSV23 vaccine. When indicated, people who have unknown immunization and have no record of immunization should receive PPSV23 vaccine. One-time revaccination 5 years after the first dose of PPSV23 is recommended for people aged 19-64 years who have chronic kidney failure, nephrotic syndrome, asplenia, or immunocompromised conditions. People who received 1-2 doses of PPSV23 before age 30 years should receive  another dose of PPSV23 vaccine at age 68 years or later if at least 5 years have passed since the previous dose. Doses of PPSV23 are not needed for people immunized with PPSV23 at or after age 16 years.  Meningococcal vaccine. Adults with asplenia or  persistent complement component deficiencies should receive 2 doses of quadrivalent meningococcal conjugate (MenACWY-D) vaccine. The doses should be obtained at least 2 months apart. Microbiologists working with certain meningococcal bacteria, military recruits, people at risk during an outbreak, and people who travel to or live in countries with a high rate of meningitis should be immunized. A first-year college student up through age 21 years who is living in a residence hall should receive a dose if she did not receive a dose on or after her 16th birthday. Adults who have certain high-risk conditions should receive one or more doses of vaccine.  Hepatitis A vaccine. Adults who wish to be protected from this disease, have certain high-risk conditions, work with hepatitis A-infected animals, work in hepatitis A research labs, or travel to or work in countries with a high rate of hepatitis A should be immunized. Adults who were previously unvaccinated and who anticipate close contact with an international adoptee during the first 60 days after arrival in the [place] from a country with a high rate of hepatitis A should be immunized.  Hepatitis B vaccine. Adults who wish to be protected from this disease, have certain high-risk conditions, may be exposed to blood or other infectious body fluids, are household contacts or sex partners of hepatitis B positive people, are clients or workers in certain care facilities, or travel to or work in countries with a high rate of hepatitis B should be immunized.  Haemophilus influenzae type b (Hib) vaccine. A previously unvaccinated person with asplenia or sickle cell disease or having a scheduled splenectomy should  receive 1 dose of Hib vaccine. Regardless of previous immunization, a recipient of a hematopoietic stem cell transplant should receive a 3-dose series 6-12 months after her successful transplant. Hib vaccine is not recommended for adults with HIV infection. Preventive Services / Frequency Ages 19 to 39 years  Blood pressure check.** / Every 3-5 years.  Lipid and cholesterol check.** / Every 5 years beginning at age 20.  Clinical breast exam.** / Every 3 years for women in their 20s and 30s.  BRCA-related cancer risk assessment.** / For women who have family members with a BRCA-related cancer (breast, ovarian, tubal, or peritoneal cancers).  Pap test.** / Every 2 years from ages 21 through 29. Every 3 years starting at age 30 through age 65 or 70 with a history of 3 consecutive normal Pap tests.  HPV screening.** / Every 3 years from ages 30 through ages 65 to 70 with a history of 3 consecutive normal Pap tests.  Hepatitis C blood test.** / For any individual with known risks for hepatitis C.  Skin self-exam. / Monthly.  Influenza vaccine. / Every year.  Tetanus, diphtheria, and acellular pertussis (Tdap, Td) vaccine.** / Consult your health care provider. Pregnant women should receive 1 dose of Tdap vaccine during each pregnancy. 1 dose of Td every 10 years.  Varicella vaccine.** / Consult your health care provider. Pregnant females who do not have evidence of immunity should receive the first dose after pregnancy.  HPV vaccine. / 3 doses over 6 months, if 26 and younger. The vaccine is not recommended for use in pregnant females. However, pregnancy testing is not needed before receiving a dose.  Measles, mumps, rubella (MMR) vaccine.** / You need at least 1 dose of MMR if you were born in 1957 or later. You may also need a 2nd dose. For females of childbearing age, rubella immunity should be determined. If there is no evidence of   immunity, females who are not pregnant should be  vaccinated. If there is no evidence of immunity, females who are pregnant should delay immunization until after pregnancy.  Pneumococcal 13-valent conjugate (PCV13) vaccine.** / Consult your health care provider.  Pneumococcal polysaccharide (PPSV23) vaccine.** / 1 to 2 doses if you smoke cigarettes or if you have certain conditions.  Meningococcal vaccine.** / 1 dose if you are age 22 to 12 years and a Market researcher living in a residence hall, or have one of several medical conditions, you need to get vaccinated against meningococcal disease. You may also need additional booster doses.  Hepatitis A vaccine.** / Consult your health care provider.  Hepatitis B vaccine.** / Consult your health care provider.  Haemophilus influenzae type b (Hib) vaccine.** / Consult your health care provider. Ages 88 to 25 years  Blood pressure check.** / Every year.  Lipid and cholesterol check.** / Every 5 years beginning at age 80 years.  Lung cancer screening. / Every year if you are aged 70-80 years and have a 30-pack-year history of smoking and currently smoke or have quit within the past 15 years. Yearly screening is stopped once you have quit smoking for at least 15 years or develop a health problem that would prevent you from having lung cancer treatment.  Clinical breast exam.** / Every year after age 28 years.  BRCA-related cancer risk assessment.** / For women who have family members with a BRCA-related cancer (breast, ovarian, tubal, or peritoneal cancers).  Mammogram.** / Every year beginning at age 60 years and continuing for as long as you are in good health. Consult with your health care provider.  Pap test.** / Every 3 years starting at age 54 years through age 60 or 5 years with a history of 3 consecutive normal Pap tests.  HPV screening.** / Every 3 years from ages 81 years through ages 73 to 28 years with a history of 3 consecutive normal Pap tests.  Fecal occult blood  test (FOBT) of stool. / Every year beginning at age 43 years and continuing until age 61 years. You may not need to do this test if you get a colonoscopy every 10 years.  Flexible sigmoidoscopy or colonoscopy.** / Every 5 years for a flexible sigmoidoscopy or every 10 years for a colonoscopy beginning at age 52 years and continuing until age 79 years.  Hepatitis C blood test.** / For all people born from [birth year] through 1963 and any individual with known risks for hepatitis C.  Skin self-exam. / Monthly.  Influenza vaccine. / Every year.  Tetanus, diphtheria, and acellular pertussis (Tdap/Td) vaccine.** / Consult your health care provider. Pregnant women should receive 1 dose of Tdap vaccine during each pregnancy. 1 dose of Td every 10 years.  Varicella vaccine.** / Consult your health care provider. Pregnant females who do not have evidence of immunity should receive the first dose after pregnancy.  Zoster vaccine.** / 1 dose for adults aged 45 years or older.  Measles, mumps, rubella (MMR) vaccine.** / You need at least 1 dose of MMR if you were born in 1957 or later. You may also need a second dose. For females of childbearing age, rubella immunity should be determined. If there is no evidence of immunity, females who are not pregnant should be vaccinated. If there is no evidence of immunity, females who are pregnant should delay immunization until after pregnancy.  Pneumococcal 13-valent conjugate (PCV13) vaccine.** / Consult your health care provider.  Pneumococcal polysaccharide (PPSV23) vaccine.** /  1 to 2 doses if you smoke cigarettes or if you have certain conditions.  Meningococcal vaccine.** / Consult your health care provider.  Hepatitis A vaccine.** / Consult your health care provider.  Hepatitis B vaccine.** / Consult your health care provider.  Haemophilus influenzae type b (Hib) vaccine.** / Consult your health care provider. Ages 65 years and over  Blood pressure check.**  / Every year.  Lipid and cholesterol check.** / Every 5 years beginning at age 20 years.  Lung cancer screening. / Every year if you are aged 55-80 years and have a 30-pack-year history of smoking and currently smoke or have quit within the past 15 years. Yearly screening is stopped once you have quit smoking for at least 15 years or develop a health problem that would prevent you from having lung cancer treatment.  Clinical breast exam.** / Every year after age 40 years.  BRCA-related cancer risk assessment.** / For women who have family members with a BRCA-related cancer (breast, ovarian, tubal, or peritoneal cancers).  Mammogram.** / Every year beginning at age 40 years and continuing for as long as you are in good health. Consult with your health care provider.  Pap test.** / Every 3 years starting at age 30 years through age 65 or 70 years with 3 consecutive normal Pap tests. Testing can be stopped between 65 and 70 years with 3 consecutive normal Pap tests and no abnormal Pap or HPV tests in the past 10 years.  HPV screening.** / Every 3 years from ages 30 years through ages 65 or 70 years with a history of 3 consecutive normal Pap tests. Testing can be stopped between 65 and 70 years with 3 consecutive normal Pap tests and no abnormal Pap or HPV tests in the past 10 years.  Fecal occult blood test (FOBT) of stool. / Every year beginning at age 50 years and continuing until age 75 years. You may not need to do this test if you get a colonoscopy every 10 years.  Flexible sigmoidoscopy or colonoscopy.** / Every 5 years for a flexible sigmoidoscopy or every 10 years for a colonoscopy beginning at age 50 years and continuing until age 75 years.  Hepatitis C blood test.** / For all people born from 1945 through 1965 and any individual with known risks for hepatitis C.  Osteoporosis screening.** / A one-time screening for women ages 65 years and over and women at risk for fractures or  osteoporosis.  Skin self-exam. / Monthly.  Influenza vaccine. / Every year.  Tetanus, diphtheria, and acellular pertussis (Tdap/Td) vaccine.** / 1 dose of Td every 10 years.  Varicella vaccine.** / Consult your health care provider.  Zoster vaccine.** / 1 dose for adults aged 60 years or older.  Pneumococcal 13-valent conjugate (PCV13) vaccine.** / Consult your health care provider.  Pneumococcal polysaccharide (PPSV23) vaccine.** / 1 dose for all adults aged 65 years and older.  Meningococcal vaccine.** / Consult your health care provider.  Hepatitis A vaccine.** / Consult your health care provider.  Hepatitis B vaccine.** / Consult your health care provider.  Haemophilus influenzae type b (Hib) vaccine.** / Consult your health care provider. ** Family history and personal history of risk and conditions may change your health care provider's recommendations.   This information is not intended to replace advice given to you by your health care provider. Make sure you discuss any questions you have with your health care provider.   Document Released: 11/06/2001 Document Revised: 10/01/2014 Document Reviewed: 02/05/2011 Elsevier   Interactive Patient Education [employer].

## 2016-02-07 NOTE — Progress Notes (Signed)
Pre visit review using our clinic review tool, if applicable. No additional management support is needed unless otherwise documented below in the visit note. 

## 2016-02-12 NOTE — Assessment & Plan Note (Signed)
Patient encouraged to maintain heart healthy diet, regular exercise, adequate sleep. Consider daily probiotics. Take medications as prescribed. Given and reviewed copy of ACP documents from [place] Secretary of State and encouraged to complete and return 

## 2016-02-17 ENCOUNTER — Telehealth: Payer: Self-pay | Admitting: *Deleted

## 2016-02-17 NOTE — Telephone Encounter (Signed)
Forwarded to Dr. Blyth/Robin. JG//CMA  

## 2016-02-21 ENCOUNTER — Encounter: Payer: Self-pay | Admitting: Family Medicine

## 2016-12-03 DIAGNOSIS — Z124 Encounter for screening for malignant neoplasm of cervix: Secondary | ICD-10-CM | POA: Diagnosis not present

## 2016-12-03 DIAGNOSIS — Z01419 Encounter for gynecological examination (general) (routine) without abnormal findings: Secondary | ICD-10-CM | POA: Diagnosis not present

## 2016-12-03 DIAGNOSIS — Z6831 Body mass index (BMI) 31.0-31.9, adult: Secondary | ICD-10-CM | POA: Diagnosis not present

## 2016-12-03 LAB — HM PAP SMEAR: HM Pap smear: NEGATIVE

## 2016-12-13 ENCOUNTER — Encounter: Payer: Self-pay | Admitting: Family Medicine

## 2016-12-13 ENCOUNTER — Telehealth: Payer: Self-pay | Admitting: Family Medicine

## 2016-12-13 NOTE — Telephone Encounter (Signed)
Pt dropped off document to be filled out by Provider. Environmental education officer([employer])-Physical Examination Form- 2 pages. Document put at front office tray.

## 2016-12-18 ENCOUNTER — Other Ambulatory Visit: Payer: Self-pay

## 2016-12-18 DIAGNOSIS — Z299 Encounter for prophylactic measures, unspecified: Secondary | ICD-10-CM

## 2016-12-18 DIAGNOSIS — Z20828 Contact with and (suspected) exposure to other viral communicable diseases: Secondary | ICD-10-CM

## 2016-12-19 ENCOUNTER — Other Ambulatory Visit (INDEPENDENT_AMBULATORY_CARE_PROVIDER_SITE_OTHER): Payer: 59

## 2016-12-19 ENCOUNTER — Ambulatory Visit: Payer: 59

## 2016-12-19 DIAGNOSIS — Z20828 Contact with and (suspected) exposure to other viral communicable diseases: Secondary | ICD-10-CM

## 2016-12-19 DIAGNOSIS — Z299 Encounter for prophylactic measures, unspecified: Secondary | ICD-10-CM | POA: Diagnosis not present

## 2016-12-19 LAB — URINALYSIS
BILIRUBIN URINE: NEGATIVE
Hgb urine dipstick: NEGATIVE
Ketones, ur: NEGATIVE
LEUKOCYTES UA: NEGATIVE
Nitrite: NEGATIVE
PH: 7 (ref 5.0–8.0)
Specific Gravity, Urine: 1.015 (ref 1.000–1.030)
Total Protein, Urine: NEGATIVE
UROBILINOGEN UA: 0.2 (ref 0.0–1.0)
Urine Glucose: NEGATIVE

## 2016-12-20 LAB — VARICELLA ZOSTER ANTIBODY, IGG: VARICELLA IGG: 2682 {index} — AB (ref <135.00)

## 2016-12-24 ENCOUNTER — Ambulatory Visit (INDEPENDENT_AMBULATORY_CARE_PROVIDER_SITE_OTHER): Payer: 59 | Admitting: Behavioral Health

## 2016-12-24 DIAGNOSIS — Z23 Encounter for immunization: Secondary | ICD-10-CM

## 2016-12-24 NOTE — Progress Notes (Signed)
Pre visit review using our clinic review tool, if applicable. No additional management support is needed unless otherwise documented below in the visit note.   Pt came in today for PPD placement and tolerated it well. Pt. been informed to come back in 48-72 hours to have it read.

## 2016-12-26 ENCOUNTER — Encounter: Payer: Self-pay | Admitting: Behavioral Health

## 2016-12-26 LAB — TB SKIN TEST
Induration: 0 mm
TB Skin Test: NEGATIVE

## 2016-12-31 ENCOUNTER — Ambulatory Visit (INDEPENDENT_AMBULATORY_CARE_PROVIDER_SITE_OTHER): Payer: 59 | Admitting: Behavioral Health

## 2016-12-31 DIAGNOSIS — Z23 Encounter for immunization: Secondary | ICD-10-CM

## 2016-12-31 NOTE — Progress Notes (Signed)
Pre visit review using our clinic review tool, if applicable. No additional management support is needed unless otherwise documented below in the visit note.  Patient in clinic today for 2nd PPD placement. Intradermal injection given in left arm. Patient tolerated injection well. She will return in 48-72 hours to have skin test read.

## 2017-01-02 ENCOUNTER — Ambulatory Visit: Payer: 59

## 2017-01-02 ENCOUNTER — Encounter: Payer: Self-pay | Admitting: Behavioral Health

## 2017-01-02 LAB — TB SKIN TEST
INDURATION: 0 mm
TB Skin Test: NEGATIVE

## 2017-01-04 ENCOUNTER — Ambulatory Visit: Payer: 59

## 2017-02-08 ENCOUNTER — Encounter: Payer: 59 | Admitting: Family Medicine

## 2017-03-28 ENCOUNTER — Ambulatory Visit (INDEPENDENT_AMBULATORY_CARE_PROVIDER_SITE_OTHER): Payer: 59 | Admitting: Family Medicine

## 2017-03-28 ENCOUNTER — Encounter: Payer: Self-pay | Admitting: Family Medicine

## 2017-03-28 VITALS — BP 108/60 | HR 64 | Temp 98.1°F | Resp 16 | Ht 61.0 in | Wt 164.6 lb

## 2017-03-28 DIAGNOSIS — Z0000 Encounter for general adult medical examination without abnormal findings: Secondary | ICD-10-CM

## 2017-03-28 DIAGNOSIS — E785 Hyperlipidemia, unspecified: Secondary | ICD-10-CM | POA: Diagnosis not present

## 2017-03-28 NOTE — Patient Instructions (Addendum)
Melatonin 2-10 mg  [email or web address]  Preventive Care 18-39 Years, Female Preventive care refers to lifestyle choices and visits with your health care provider that can promote health and wellness. What does preventive care include?  A yearly physical exam. This is also called an annual well check.  Dental exams once or twice a year.  Routine eye exams. Ask your health care provider how often you should have your eyes checked.  Personal lifestyle choices, including: ? Daily care of your teeth and gums. ? Regular physical activity. ? Eating a healthy diet. ? Avoiding tobacco and drug use. ? Limiting alcohol use. ? Practicing safe sex. ? Taking vitamin and mineral supplements as recommended by your health care provider. What happens during an annual well check? The services and screenings done by your health care provider during your annual well check will depend on your age, overall health, lifestyle risk factors, and family history of disease. Counseling Your health care provider may ask you questions about your:  Alcohol use.  Tobacco use.  Drug use.  Emotional well-being.  Home and relationship well-being.  Sexual activity.  Eating habits.  Work and work Statistician.  Method of birth control.  Menstrual cycle.  Pregnancy history.  Screening You may have the following tests or measurements:  Height, weight, and BMI.  Diabetes screening. This is done by checking your blood sugar (glucose) after you have not eaten for a while (fasting).  Blood pressure.  Lipid and cholesterol levels. These may be checked every 5 years starting at age 8.  Skin check.  Hepatitis C blood test.  Hepatitis B blood test.  Sexually transmitted disease (STD) testing.  BRCA-related cancer screening. This may be done if you have a family history of breast, ovarian, tubal, or peritoneal cancers.  Pelvic exam and Pap test. This may be done every 3 years starting at age 77.  Starting at age 46, this may be done every 5 years if you have a Pap test in combination with an HPV test.  Discuss your test results, treatment options, and if necessary, the need for more tests with your health care provider. Vaccines Your health care provider may recommend certain vaccines, such as:  Influenza vaccine. This is recommended every year.  Tetanus, diphtheria, and acellular pertussis (Tdap, Td) vaccine. You may need a Td booster every 10 years.  Varicella vaccine. You may need this if you have not been vaccinated.  HPV vaccine. If you are 34 or younger, you may need three doses over 6 months.  Measles, mumps, and rubella (MMR) vaccine. You may need at least one dose of MMR. You may also need a second dose.  Pneumococcal 13-valent conjugate (PCV13) vaccine. You may need this if you have certain conditions and were not previously vaccinated.  Pneumococcal polysaccharide (PPSV23) vaccine. You may need one or two doses if you smoke cigarettes or if you have certain conditions.  Meningococcal vaccine. One dose is recommended if you are age 1-21 years and a first-year college student living in a residence hall, or if you have one of several medical conditions. You may also need additional booster doses.  Hepatitis A vaccine. You may need this if you have certain conditions or if you travel or work in places where you may be exposed to hepatitis A.  Hepatitis B vaccine. You may need this if you have certain conditions or if you travel or work in places where you may be exposed to hepatitis B.  Haemophilus influenzae type b (Hib)  vaccine. You may need this if you have certain risk factors.  Talk to your health care provider about which screenings and vaccines you need and how often you need them. This information is not intended to replace advice given to you by your health care provider. Make sure you discuss any questions you have with your health care provider. Document  Released: 11/06/2001 Document Revised: 05/30/2016 Document Reviewed: 07/12/2015 Elsevier Interactive Patient Education  2017 [employer].

## 2017-03-28 NOTE — Progress Notes (Signed)
Patient ID: Mary Cunningham, female   DOB: 1981/08/27, 36 y.o.   MRN: [id number]     Subjective:  I acted as a Neurosurgeonscribe for Dr. Abner GreenspanBlyth.  Apolonio SchneidersSheketia, CMA.   Patient ID: Mary Cunningham, female    DOB: 1981/08/27, 36 y.o.   MRN: [id number]  Chief Complaint  Patient presents with  . Annual Exam    HPI  Patient is in today for annual exam.  She is doing well with no complaints. She feels well. Is trying to stay active and exercise. Maintains a heart healthy diet most days. Her irritable bowel symptoms are improved with addition of probiotics and with a heart healthy diet. Denies CP/palp/SOB/HA/congestion/fevers/GI or GU c/o. Taking meds as prescribed  Patient Care Team: Bradd CanaryBlyth, Phallon Haydu A, MD as PCP - General (Family Medicine) Hoover BrownsKulwa, Ema, MD as Consulting Physician (Obstetrics and Gynecology)   Past Medical History:  Diagnosis Date  . Heart murmur    "as a child"  . Hyperlipidemia 03/31/2017  . Irritable bowel syndrome (IBS)   . Preventative health care 02/07/2016  . Status post primary low transverse cesarean section 12/12/2014    Past Surgical History:  Procedure Laterality Date  . CESAREAN SECTION N/A 12/10/2014   Procedure: CESAREAN SECTION;  Surgeon: Hoover BrownsEma Kulwa, MD;  Location: [facility];  Service: Obstetrics;  Laterality: N/A;  . WISDOM TOOTH EXTRACTION      Family History  Problem Relation Age of Onset  . Hypertension Mother   . Asthma Mother   . GER disease Father   . Sleep apnea Father   . Hypertension Maternal Aunt   . Sarcoidosis Maternal Aunt   . Lung cancer Maternal Aunt   . Stroke Maternal Aunt   . Heart failure Maternal Grandmother   . Hypertension Maternal Grandmother   . Breast cancer Maternal Grandmother   . Endometrial cancer Maternal Grandmother   . Stroke Maternal Grandmother   . Dementia Maternal Grandmother   . Cancer Maternal Grandmother   . Lung cancer Maternal Grandfather   . Dementia Paternal Grandmother   . Healthy Paternal Grandfather     Social  History   Social History  . Marital status: Single    Spouse name: N/A  . Number of children: N/A  . Years of education: N/A   Occupational History  . RN     Social History Main Topics  . Smoking status: Never Smoker  . Smokeless tobacco: Never Used  . Alcohol use No  . Drug use: No  . Sexual activity: Yes    Birth control/ protection: IUD   Other Topics Concern  . Not on file   Social History Narrative   Lives with husband and son   No major dietary restrictions   Works at [employer]   Exercise 3-4 times a week    Outpatient Medications Prior to Visit  Medication Sig Dispense Refill  . Levonorgestrel (SKYLA) 13.5 MG IUD by Intrauterine route.    . ferrous sulfate 325 (65 FE) MG EC tablet Take 1 tablet (325 mg total) by mouth 2 (two) times daily. 60 tablet 3   No facility-administered medications prior to visit.     No Known Allergies  Review of Systems  Constitutional: Negative for fever and malaise/fatigue.  HENT: Negative for congestion.   Eyes: Negative for blurred vision.  Respiratory: Negative for cough and shortness of breath.   Cardiovascular: Negative for chest pain, palpitations and leg swelling.  Gastrointestinal: Negative for vomiting.  Musculoskeletal: Negative for back pain.  Skin: Negative for rash.  Neurological: Negative for loss of consciousness and headaches.       Objective:    Physical Exam  Constitutional: She is oriented to person, place, and time. She appears well-developed and well-nourished. No distress.  HENT:  Head: Normocephalic and atraumatic.  Eyes: Conjunctivae are normal.  Neck: Normal range of motion. No thyromegaly present.  Cardiovascular: Normal rate and regular rhythm.   Pulmonary/Chest: Effort normal and breath sounds normal. She has no wheezes.  Abdominal: Soft. Bowel sounds are normal. There is no tenderness.  Musculoskeletal: Normal range of motion. She exhibits no edema or deformity.  Neurological: She is alert and  oriented to person, place, and time.  Skin: Skin is warm and dry. She is not diaphoretic.  Psychiatric: She has a normal mood and affect.    BP 108/60 (BP Location: Left Arm, Cuff Size: Normal)   Pulse 64   Temp 98.1 F (36.7 C) (Oral)   Resp 16   Ht 5\' 1"  (1.549 m)   Wt 164 lb 9.6 oz (74.7 kg)   SpO2 97%   BMI 31.10 kg/m  Wt Readings from Last 3 Encounters:  03/28/17 164 lb 9.6 oz (74.7 kg)  02/07/16 186 lb (84.4 kg)  12/09/14 214 lb (97.1 kg)   BP Readings from Last 3 Encounters:  03/28/17 108/60  02/07/16 114/68  12/13/14 113/72     Immunization History  Administered Date(s) Administered  . Influenza,inj,Quad PF,36+ Mos 06/08/2016  . Influenza-Unspecified 05/26/2015  . PPD Test 12/24/2016, 12/31/2016  . Tdap 09/24/2013    Health Maintenance  Topic Date Due  . INFLUENZA VACCINE  04/24/2017  . PAP SMEAR  12/04/2019  . TETANUS/TDAP  09/25/2023  . HIV Screening  Completed    Lab Results  Component Value Date   WBC 6.0 03/28/2017   HGB 13.6 03/28/2017   HCT 41.1 03/28/2017   PLT 279.0 03/28/2017   GLUCOSE 81 03/28/2017   CHOL 190 03/28/2017   TRIG 65.0 03/28/2017   HDL 57.80 03/28/2017   LDLCALC 119 (H) 03/28/2017   ALT 10 03/28/2017   AST 16 03/28/2017   NA 135 03/28/2017   K 4.6 03/28/2017   CL 99 03/28/2017   CREATININE 0.94 03/28/2017   BUN 9 03/28/2017   CO2 29 03/28/2017   TSH 1.87 03/28/2017    Lab Results  Component Value Date   TSH 1.87 03/28/2017   Lab Results  Component Value Date   WBC 6.0 03/28/2017   HGB 13.6 03/28/2017   HCT 41.1 03/28/2017   MCV 89.1 03/28/2017   PLT 279.0 03/28/2017   Lab Results  Component Value Date   NA 135 03/28/2017   K 4.6 03/28/2017   CO2 29 03/28/2017   GLUCOSE 81 03/28/2017   BUN 9 03/28/2017   CREATININE 0.94 03/28/2017   BILITOT 0.5 03/28/2017   ALKPHOS 63 03/28/2017   AST 16 03/28/2017   ALT 10 03/28/2017   PROT 7.5 03/28/2017   ALBUMIN 4.4 03/28/2017   CALCIUM 9.9 03/28/2017   GFR  87.72 03/28/2017   Lab Results  Component Value Date   CHOL 190 03/28/2017   Lab Results  Component Value Date   HDL 57.80 03/28/2017   Lab Results  Component Value Date   LDLCALC 119 (H) 03/28/2017   Lab Results  Component Value Date   TRIG 65.0 03/28/2017   Lab Results  Component Value Date   CHOLHDL 3 03/28/2017   No results found for: HGBA1C  Assessment & Plan:   Problem List Items Addressed This Visit    Preventative health care - Primary    Patient encouraged to maintain heart healthy diet, regular exercise, adequate sleep. Consider daily probiotics. Take medications as prescribed      Relevant Orders   CBC with Differential/Platelet (Completed)   Comprehensive metabolic panel (Completed)   Lipid panel (Completed)   TSH (Completed)   POCT Urinalysis Dipstick (Automated)   Hyperlipidemia    Encouraged heart healthy diet, increase exercise, avoid trans fats, consider a krill oil cap daily         I have discontinued Ms. Pinnock's ferrous sulfate. I am also having her maintain her Levonorgestrel and multivitamin.  Meds ordered this encounter  Medications  . Multiple Vitamin (MULTIVITAMIN) tablet    Sig: Take 1 tablet by mouth daily.    CMA served as Neurosurgeon during this visit. History, Physical and Plan performed by medical provider. Documentation and orders reviewed and attested to.  Danise Edge, MD

## 2017-03-29 LAB — CBC WITH DIFFERENTIAL/PLATELET
Basophils Absolute: 0 10*3/uL (ref 0.0–0.1)
Basophils Relative: 0.7 % (ref 0.0–3.0)
EOS ABS: 0.1 10*3/uL (ref 0.0–0.7)
Eosinophils Relative: 1.3 % (ref 0.0–5.0)
HCT: 41.1 % (ref 36.0–46.0)
HEMOGLOBIN: 13.6 g/dL (ref 12.0–15.0)
Lymphocytes Relative: 49.3 % — ABNORMAL HIGH (ref 12.0–46.0)
Lymphs Abs: 3 10*3/uL (ref 0.7–4.0)
MCHC: 33.2 g/dL (ref 30.0–36.0)
MCV: 89.1 fl (ref 78.0–100.0)
Monocytes Absolute: 0.4 10*3/uL (ref 0.1–1.0)
Monocytes Relative: 6.1 % (ref 3.0–12.0)
Neutro Abs: 2.6 10*3/uL (ref 1.4–7.7)
Neutrophils Relative %: 42.6 % — ABNORMAL LOW (ref 43.0–77.0)
PLATELETS: 279 10*3/uL (ref 150.0–400.0)
RBC: 4.61 Mil/uL (ref 3.87–5.11)
RDW: 14 % (ref 11.5–15.5)
WBC: 6 10*3/uL (ref 4.0–10.5)

## 2017-03-29 LAB — COMPREHENSIVE METABOLIC PANEL
ALT: 10 U/L (ref 0–35)
AST: 16 U/L (ref 0–37)
Albumin: 4.4 g/dL (ref 3.5–5.2)
Alkaline Phosphatase: 63 U/L (ref 39–117)
BUN: 9 mg/dL (ref 6–23)
CHLORIDE: 99 meq/L (ref 96–112)
CO2: 29 meq/L (ref 19–32)
Calcium: 9.9 mg/dL (ref 8.4–10.5)
Creatinine, Ser: 0.94 mg/dL (ref 0.40–1.20)
GFR: 87.72 mL/min (ref >60.00)
GLUCOSE: 81 mg/dL (ref 70–99)
POTASSIUM: 4.6 meq/L (ref 3.5–5.1)
SODIUM: 135 meq/L (ref 135–145)
Total Bilirubin: 0.5 mg/dL (ref 0.2–1.2)
Total Protein: 7.5 g/dL (ref 6.0–8.3)

## 2017-03-29 LAB — LIPID PANEL
Cholesterol: 190 mg/dL (ref 0–200)
HDL: 57.8 mg/dL (ref >39.00)
LDL CALC: 119 mg/dL — AB (ref 0–99)
NONHDL: 131.9
Total CHOL/HDL Ratio: 3
Triglycerides: 65 mg/dL (ref 0.0–149.0)
VLDL: 13 mg/dL (ref 0.0–40.0)

## 2017-03-29 LAB — TSH: TSH: 1.87 u[IU]/mL (ref 0.35–4.50)

## 2017-03-31 ENCOUNTER — Encounter: Payer: Self-pay | Admitting: Family Medicine

## 2017-03-31 DIAGNOSIS — E785 Hyperlipidemia, unspecified: Secondary | ICD-10-CM

## 2017-03-31 HISTORY — DX: Hyperlipidemia, unspecified: E78.5

## 2017-03-31 NOTE — Assessment & Plan Note (Signed)
Encouraged heart healthy diet, increase exercise, avoid trans fats, consider a krill oil cap daily 

## 2017-03-31 NOTE — Assessment & Plan Note (Signed)
Patient encouraged to maintain heart healthy diet, regular exercise, adequate sleep. Consider daily probiotics. Take medications as prescribed 

## 2019-08-17 ENCOUNTER — Telehealth: Payer: Self-pay | Admitting: Family Medicine

## 2019-08-17 NOTE — Telephone Encounter (Signed)
Pt called in to ask if pcp would work her in to have her CPE before the end of the year?   Please assist pt directly

## 2019-08-18 NOTE — Telephone Encounter (Signed)
  Mary Cunningham , can you call patient and let her know our situation as far as CPE is concerned. If she needs the CPE done by the end of the year she may have to see Melissa  I have looked and can not find a place for patient at this time Please advise

## 2019-09-22 ENCOUNTER — Ambulatory Visit (INDEPENDENT_AMBULATORY_CARE_PROVIDER_SITE_OTHER): Payer: Managed Care, Other (non HMO) | Admitting: Family Medicine

## 2019-09-22 ENCOUNTER — Other Ambulatory Visit: Payer: Self-pay

## 2019-09-22 ENCOUNTER — Encounter: Payer: Self-pay | Admitting: Family Medicine

## 2019-09-22 VITALS — BP 115/64 | HR 79 | Temp 97.4°F | Resp 18 | Ht 61.0 in | Wt 192.0 lb

## 2019-09-22 DIAGNOSIS — E559 Vitamin D deficiency, unspecified: Secondary | ICD-10-CM | POA: Diagnosis not present

## 2019-09-22 DIAGNOSIS — G4700 Insomnia, unspecified: Secondary | ICD-10-CM | POA: Diagnosis not present

## 2019-09-22 DIAGNOSIS — E782 Mixed hyperlipidemia: Secondary | ICD-10-CM | POA: Diagnosis not present

## 2019-09-22 DIAGNOSIS — Z0000 Encounter for general adult medical examination without abnormal findings: Secondary | ICD-10-CM | POA: Diagnosis not present

## 2019-09-22 NOTE — Patient Instructions (Addendum)
Omron blood pressure cuff, upper arm Pulse  Oximeter want oxygen in the 90s   Multivitamin with minerals, selenium  ECASA 81 mg daily Vitamin D 2000 IU daily  Melatonin 3-5 mg bedtime   [employer] at [email or web address] Preventive Care 93-36 Years Old, Female Preventive care refers to visits with your health care provider and lifestyle choices that can promote health and wellness. This includes:  A yearly physical exam. This may also be called an annual well check.  Regular dental visits and eye exams.  Immunizations.  Screening for certain conditions.  Healthy lifestyle choices, such as eating a healthy diet, getting regular exercise, not using drugs or products that contain nicotine and tobacco, and limiting alcohol use. What can I expect for my preventive care visit? Physical exam Your health care provider will check your:  Height and weight. This may be used to calculate body mass index (BMI), which tells if you are at a healthy weight.  Heart rate and blood pressure.  Skin for abnormal spots. Counseling Your health care provider may ask you questions about your:  Alcohol, tobacco, and drug use.  Emotional well-being.  Home and relationship well-being.  Sexual activity.  Eating habits.  Work and work Statistician.  Method of birth control.  Menstrual cycle.  Pregnancy history. What immunizations do I need?  Influenza (flu) vaccine  This is recommended every year. Tetanus, diphtheria, and pertussis (Tdap) vaccine  You may need a Td booster every 10 years. Varicella (chickenpox) vaccine  You may need this if you have not been vaccinated. Human papillomavirus (HPV) vaccine  If recommended by your health care provider, you may need three doses over 6 months. Measles, mumps, and rubella (MMR) vaccine  You may need at least one dose of MMR. You may also need a second dose. Meningococcal conjugate (MenACWY) vaccine  One dose is recommended if you are  age 32-21 years and a first-year college student living in a residence hall, or if you have one of several medical conditions. You may also need additional booster doses. Pneumococcal conjugate (PCV13) vaccine  You may need this if you have certain conditions and were not previously vaccinated. Pneumococcal polysaccharide (PPSV23) vaccine  You may need one or two doses if you smoke cigarettes or if you have certain conditions. Hepatitis A vaccine  You may need this if you have certain conditions or if you travel or work in places where you may be exposed to hepatitis A. Hepatitis B vaccine  You may need this if you have certain conditions or if you travel or work in places where you may be exposed to hepatitis B. Haemophilus influenzae type b (Hib) vaccine  You may need this if you have certain conditions. You may receive vaccines as individual doses or as more than one vaccine together in one shot (combination vaccines). Talk with your health care provider about the risks and benefits of combination vaccines. What tests do I need?  Blood tests  Lipid and cholesterol levels. These may be checked every 5 years starting at age 43.  Hepatitis C test.  Hepatitis B test. Screening  Diabetes screening. This is done by checking your blood sugar (glucose) after you have not eaten for a while (fasting).  Sexually transmitted disease (STD) testing.  BRCA-related cancer screening. This may be done if you have a family history of breast, ovarian, tubal, or peritoneal cancers.  Pelvic exam and Pap test. This may be done every 3 years starting at age 89. Starting at  age 20, this may be done every 5 years if you have a Pap test in combination with an HPV test. Talk with your health care provider about your test results, treatment options, and if necessary, the need for more tests. Follow these instructions at home: Eating and drinking   Eat a diet that includes fresh fruits and vegetables,  whole grains, lean protein, and low-fat dairy.  Take vitamin and mineral supplements as recommended by your health care provider.  Do not drink alcohol if: ? Your health care provider tells you not to drink. ? You are pregnant, may be pregnant, or are planning to become pregnant.  If you drink alcohol: ? Limit how much you have to 0-1 drink a day. ? Be aware of how much alcohol is in your drink. In the [place] one drink equals one 12 oz bottle of beer (355 mL), one 5 oz glass of wine (148 mL), or one 1 oz glass of hard liquor (44 mL). Lifestyle  Take daily care of your teeth and gums.  Stay active. Exercise for at least 30 minutes on 5 or more days each week.  Do not use any products that contain nicotine or tobacco, such as cigarettes, e-cigarettes, and chewing tobacco. If you need help quitting, ask your health care provider.  If you are sexually active, practice safe sex. Use a condom or other form of birth control (contraception) in order to prevent pregnancy and STIs (sexually transmitted infections). If you plan to become pregnant, see your health care provider for a preconception visit. What's next?  Visit your health care provider once a year for a well check visit.  Ask your health care provider how often you should have your eyes and teeth checked.  Stay up to date on all vaccines. This information is not intended to replace advice given to you by your health care provider. Make sure you discuss any questions you have with your health care provider. Document Released: 11/06/2001 Document Revised: 05/22/2018 Document Reviewed: 05/22/2018 Elsevier Patient Education  2020 [employer].

## 2019-09-23 DIAGNOSIS — G4700 Insomnia, unspecified: Secondary | ICD-10-CM | POA: Insufficient documentation

## 2019-09-23 LAB — LIPID PANEL
Cholesterol: 186 mg/dL (ref 0–200)
HDL: 55 mg/dL (ref >39.00)
LDL Cholesterol: 117 mg/dL — ABNORMAL HIGH (ref 0–99)
NonHDL: 130.94
Total CHOL/HDL Ratio: 3
Triglycerides: 71 mg/dL (ref 0.0–149.0)
VLDL: 14.2 mg/dL (ref 0.0–40.0)

## 2019-09-23 LAB — TSH: TSH: 1.22 u[IU]/mL (ref 0.35–4.50)

## 2019-09-23 LAB — COMPREHENSIVE METABOLIC PANEL
ALT: 13 U/L (ref 0–35)
AST: 18 U/L (ref 0–37)
Albumin: 4.5 g/dL (ref 3.5–5.2)
Alkaline Phosphatase: 64 U/L (ref 39–117)
BUN: 15 mg/dL (ref 6–23)
CO2: 27 mEq/L (ref 19–32)
Calcium: 10.2 mg/dL (ref 8.4–10.5)
Chloride: 102 mEq/L (ref 96–112)
Creatinine, Ser: 0.96 mg/dL (ref 0.40–1.20)
GFR: 79.4 mL/min (ref >60.00)
Glucose, Bld: 86 mg/dL (ref 70–99)
Potassium: 5 mEq/L (ref 3.5–5.1)
Sodium: 138 mEq/L (ref 135–145)
Total Bilirubin: 0.5 mg/dL (ref 0.2–1.2)
Total Protein: 7.4 g/dL (ref 6.0–8.3)

## 2019-09-23 LAB — CBC
HCT: 40 % (ref 36.0–46.0)
Hemoglobin: 13.2 g/dL (ref 12.0–15.0)
MCHC: 33 g/dL (ref 30.0–36.0)
MCV: 90.9 fl (ref 78.0–100.0)
Platelets: 275 10*3/uL (ref 150.0–400.0)
RBC: 4.39 Mil/uL (ref 3.87–5.11)
RDW: 13.4 % (ref 11.5–15.5)
WBC: 7.4 10*3/uL (ref 4.0–10.5)

## 2019-09-23 LAB — VITAMIN D 25 HYDROXY (VIT D DEFICIENCY, FRACTURES): VITD: 37.44 ng/mL (ref 30.00–100.00)

## 2019-09-23 NOTE — Assessment & Plan Note (Signed)
Encouraged heart healthy diet, increase exercise, avoid trans fats, consider a krill oil cap daily 

## 2019-09-23 NOTE — Assessment & Plan Note (Signed)
Encouraged good sleep hygiene such as dark, quiet room. No blue/green glowing lights such as computer screens in bedroom. No alcohol or stimulants in evening. Cut down on caffeine as able. Regular exercise is helpful but not just prior to bed time. Falls asleep well but awakens frequently and has trouble falling asleep again. Encouraged to try Melatonin 1-5 mg qhs.

## 2019-09-23 NOTE — Assessment & Plan Note (Signed)
Tolerating statin, encouraged heart healthy diet, avoid trans fats, minimize simple carbs and saturated fats. Increase exercise as tolerated. Labs ordered and reviewed.

## 2019-09-23 NOTE — Progress Notes (Signed)
Subjective:    Patient ID: Mary Cunningham, female    DOB: April 16, 1981, 38 y.o.   MRN: [id number]  No chief complaint on file.   HPI Patient is in today for annual preventative exam. She is doing well. 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 The pandemic has been very stressful but she is manaing well. No recent febrile illness or recent hospitalizations. Her son is 69 years old now and doing well. Patient is trying to maintain a heart healthy diet but does not have regular exercise. Denies CP/palp/SOB/HA/congestion/fevers/GI or GU c/o. Taking meds as prescribed  Past Medical History:  Diagnosis Date  . Heart murmur    "as a child"  . Hyperlipidemia 03/31/2017  . Irritable bowel syndrome (IBS)   . Preventative health care 02/07/2016  . Status post primary low transverse cesarean section 12/12/2014    Past Surgical History:  Procedure Laterality Date  . CESAREAN SECTION N/A 12/10/2014   Procedure: CESAREAN SECTION;  Surgeon: Hoover Browns, MD;  Location: [facility];  Service: Obstetrics;  Laterality: N/A;  . WISDOM TOOTH EXTRACTION      Family History  Problem Relation Age of Onset  . Hypertension Mother   . Asthma Mother   . GER disease Father   . Sleep apnea Father   . Hypertension Maternal Aunt   . Sarcoidosis Maternal Aunt   . Lung cancer Maternal Aunt   . Stroke Maternal Aunt   . Heart failure Maternal Grandmother   . Hypertension Maternal Grandmother   . Breast cancer Maternal Grandmother   . Endometrial cancer Maternal Grandmother   . Stroke Maternal Grandmother   . Dementia Maternal Grandmother   . Cancer Maternal Grandmother   . Lung cancer Maternal Grandfather   . Dementia Paternal Grandmother   . Healthy Paternal Grandfather     Social History   Socioeconomic History  . Marital status: Single    Spouse name: Not on file  . Number of children: Not on file  . Years of education: Not on file  . Highest education level: Not on file   Occupational History  . Occupation: Charity fundraiser   Tobacco Use  . Smoking status: Never Smoker  . Smokeless tobacco: Never Used  Substance and Sexual Activity  . Alcohol use: No  . Drug use: No  . Sexual activity: Yes    Birth control/protection: I.U.D.  Other Topics Concern  . Not on file  Social History Narrative   Lives with husband and son   No major dietary restrictions   Works at [employer]   Exercise 3-4 times a week   Social Determinants of Corporate investment banker Strain:   . Difficulty of Paying Living Expenses: Not on file  Food Insecurity:   . Worried About Programme researcher, broadcasting/film/video in the Last Year: Not on file  . Ran Out of Food in the Last Year: Not on file  Transportation Needs:   . Lack of Transportation (Medical): Not on file  . Lack of Transportation (Non-Medical): Not on file  Physical Activity:   . Days of Exercise per Week: Not on file  . Minutes of Exercise per Session: Not on file  Stress:   . Feeling of Stress : Not on file  Social Connections:   . Frequency of Communication with Friends and Family: Not on file  . Frequency of Social Gatherings with Friends and Family: Not on file  . Attends Religious Services: Not  on file  . Active Member of Clubs or Organizations: Not on file  . Attends BankerClub or Organization Meetings: Not on file  . Marital Status: Not on file  Intimate Partner Violence:   . Fear of Current or Ex-Partner: Not on file  . Emotionally Abused: Not on file  . Physically Abused: Not on file  . Sexually Abused: Not on file    Outpatient Medications Prior to Visit  Medication Sig Dispense Refill  . Levonorgestrel (SKYLA) 13.5 MG IUD by Intrauterine route.    . Multiple Vitamin (MULTIVITAMIN) tablet Take 1 tablet by mouth daily.     No facility-administered medications prior to visit.    No Known Allergies  Review of Systems  Constitutional: Negative for chills, fever and malaise/fatigue.  HENT: Negative for congestion and hearing loss.     Eyes: Negative for discharge.  Respiratory: Negative for cough, sputum production and shortness of breath.   Cardiovascular: Negative for chest pain, palpitations and leg swelling.  Gastrointestinal: Negative for abdominal pain, blood in stool, constipation, diarrhea, heartburn, nausea and vomiting.  Genitourinary: Negative for dysuria, frequency, hematuria and urgency.  Musculoskeletal: Negative for back pain, falls and myalgias.  Skin: Negative for rash.  Neurological: Negative for dizziness, sensory change, loss of consciousness, weakness and headaches.  Endo/Heme/Allergies: Negative for environmental allergies. Does not bruise/bleed easily.  Psychiatric/Behavioral: Negative for depression and suicidal ideas. The patient has insomnia. The patient is not nervous/anxious.        Objective:    Physical Exam Constitutional:      General: She is not in acute distress.    Appearance: She is well-developed.  HENT:     Head: Normocephalic and atraumatic.  Eyes:     Conjunctiva/sclera: Conjunctivae normal.  Neck:     Thyroid: No thyromegaly.  Cardiovascular:     Rate and Rhythm: Normal rate and regular rhythm.     Heart sounds: Normal heart sounds. No murmur.  Pulmonary:     Effort: Pulmonary effort is normal. No respiratory distress.     Breath sounds: Normal breath sounds.  Abdominal:     General: Bowel sounds are normal. There is no distension.     Palpations: Abdomen is soft. There is no mass.     Tenderness: There is no abdominal tenderness.  Musculoskeletal:     Cervical back: Neck supple.  Lymphadenopathy:     Cervical: No cervical adenopathy.  Skin:    General: Skin is warm and dry.  Neurological:     Mental Status: She is alert and oriented to person, place, and time.  Psychiatric:        Behavior: Behavior normal.     BP 115/64 (BP Location: Left Arm, Patient Position: Sitting, Cuff Size: Normal)   Pulse 79   Temp (!) 97.4 F (36.3 C) (Temporal)   Resp 18    Ht 5\' 1"  (1.549 m)   Wt 192 lb (87.1 kg)   SpO2 100%   BMI 36.28 kg/m  Wt Readings from Last 3 Encounters:  09/22/19 192 lb (87.1 kg)  03/28/17 164 lb 9.6 oz (74.7 kg)  02/07/16 186 lb (84.4 kg)    Diabetic Foot Exam - Simple   No data filed     Lab Results  Component Value Date   WBC 6.0 03/28/2017   HGB 13.6 03/28/2017   HCT 41.1 03/28/2017   PLT 279.0 03/28/2017   GLUCOSE 81 03/28/2017   CHOL 190 03/28/2017   TRIG 65.0 03/28/2017   HDL 57.80  03/28/2017   LDLCALC 119 (H) 03/28/2017   ALT 10 03/28/2017   AST 16 03/28/2017   NA 135 03/28/2017   K 4.6 03/28/2017   CL 99 03/28/2017   CREATININE 0.94 03/28/2017   BUN 9 03/28/2017   CO2 29 03/28/2017   TSH 1.87 03/28/2017    Lab Results  Component Value Date   TSH 1.87 03/28/2017   Lab Results  Component Value Date   WBC 6.0 03/28/2017   HGB 13.6 03/28/2017   HCT 41.1 03/28/2017   MCV 89.1 03/28/2017   PLT 279.0 03/28/2017   Lab Results  Component Value Date   NA 135 03/28/2017   K 4.6 03/28/2017   CO2 29 03/28/2017   GLUCOSE 81 03/28/2017   BUN 9 03/28/2017   CREATININE 0.94 03/28/2017   BILITOT 0.5 03/28/2017   ALKPHOS 63 03/28/2017   AST 16 03/28/2017   ALT 10 03/28/2017   PROT 7.5 03/28/2017   ALBUMIN 4.4 03/28/2017   CALCIUM 9.9 03/28/2017   GFR 87.72 03/28/2017   Lab Results  Component Value Date   CHOL 190 03/28/2017   Lab Results  Component Value Date   HDL 57.80 03/28/2017   Lab Results  Component Value Date   LDLCALC 119 (H) 03/28/2017   Lab Results  Component Value Date   TRIG 65.0 03/28/2017   Lab Results  Component Value Date   CHOLHDL 3 03/28/2017   No results found for: HGBA1C     Assessment & Plan:   Problem List Items Addressed This Visit    Preventative health care    Tolerating statin, encouraged heart healthy diet, avoid trans fats, minimize simple carbs and saturated fats. Increase exercise as tolerated. Labs ordered and reviewed.       Relevant Orders    CBC   CMP   TSH   Hyperlipidemia    Encouraged heart healthy diet, increase exercise, avoid trans fats, consider a krill oil cap daily      Relevant Orders   Lipid panel   Insomnia    Encouraged good sleep hygiene such as dark, quiet room. No blue/green glowing lights such as computer screens in bedroom. No alcohol or stimulants in evening. Cut down on caffeine as able. Regular exercise is helpful but not just prior to bed time. Falls asleep well but awakens frequently and has trouble falling asleep again. Encouraged to try Melatonin 1-5 mg qhs.        Other Visit Diagnoses    Vitamin D deficiency    -  Primary   Relevant Orders   VITAMIN D      I am having Mary Cunningham maintain her Levonorgestrel and multivitamin.  No orders of the defined types were placed in this encounter.    Penni Homans, MD

## 2019-12-16 ENCOUNTER — Encounter: Payer: Self-pay | Admitting: Family Medicine

## 2019-12-16 ENCOUNTER — Other Ambulatory Visit: Payer: Self-pay

## 2019-12-16 ENCOUNTER — Ambulatory Visit (INDEPENDENT_AMBULATORY_CARE_PROVIDER_SITE_OTHER): Payer: 59 | Admitting: Family Medicine

## 2019-12-16 DIAGNOSIS — H8111 Benign paroxysmal vertigo, right ear: Secondary | ICD-10-CM | POA: Diagnosis not present

## 2019-12-16 MED ORDER — PREDNISONE 20 MG PO TABS: 40.0000 mg | ORAL_TABLET | Freq: Every day | ORAL | 0 refills | Status: AC

## 2019-12-16 MED FILL — predniSONE 20 MG TABS: 20 | 5 days supply | Qty: 10 | Fill #0

## 2019-12-16 NOTE — Progress Notes (Signed)
CC: Dizziness  Mary Cunningham is 39 y.o. pt here for dizziness. Due to COVID-19 pandemic, we are interacting via web portal for an electronic face-to-face visit. I verified patient's ID using 2 identifiers. Patient agreed to proceed with visit via this method. Patient is at work, I am at office. Patient and I are present for visit.   Duration: 1 month  Lasts for a few seconds after a head movement/extension Pass out? No Spinning? Yes Recent illness/fever? No Headache? No Neurologic signs? No Change in PO intake? No  ROS:  Neuro: As noted in HPI Eyes: No vision changes  Past Medical History:  Diagnosis Date  . Heart murmur    "as a child"  . Hyperlipidemia 03/31/2017  . Irritable bowel syndrome (IBS)   . Preventative health care 02/07/2016  . Status post primary low transverse cesarean section 12/12/2014    Family History  Problem Relation Age of Onset  . Hypertension Mother   . Asthma Mother   . GER disease Father   . Sleep apnea Father   . Hypertension Maternal Aunt   . Sarcoidosis Maternal Aunt   . Lung cancer Maternal Aunt   . Stroke Maternal Aunt   . Heart failure Maternal Grandmother   . Hypertension Maternal Grandmother   . Breast cancer Maternal Grandmother   . Endometrial cancer Maternal Grandmother   . Stroke Maternal Grandmother   . Dementia Maternal Grandmother   . Cancer Maternal Grandmother   . Lung cancer Maternal Grandfather   . Dementia Paternal Grandmother   . Healthy Paternal Grandfather     Allergies as of 12/16/2019   No Known Allergies     Medication List       Accurate as of December 16, 2019  3:01 PM. If you have any questions, ask your nurse or doctor.        multivitamin tablet Take 1 tablet by mouth daily.   predniSONE 20 MG tablet Commonly known as: DELTASONE Take 2 tablets (40 mg total) by mouth daily with breakfast for 5 days. Started by: Sharlene Dory, DO   Skyla 13.5 MG Iud Generic drug: Levonorgestrel by  Intrauterine route.      Exam No conversational dyspnea Age appropriate judgment and insight Nml affect and mood  Benign paroxysmal positional vertigo of right ear  Epley Maneuvers sent via MyChart.  Pred should it be related to allergies or labyrinthitis. Stay hydrated.  F/u prn. Pt voiced understanding and agreement to the plan.  Jilda Roche Spalding, DO 12/16/19 3:01 PM

## 2021-07-25 ENCOUNTER — Ambulatory Visit: Payer: 59 | Admitting: Family Medicine

## 2021-08-22 ENCOUNTER — Telehealth: Payer: 59 | Admitting: Physician Assistant

## 2021-08-22 DIAGNOSIS — G8929 Other chronic pain: Secondary | ICD-10-CM | POA: Diagnosis not present

## 2021-08-22 DIAGNOSIS — M545 Low back pain, unspecified: Secondary | ICD-10-CM

## 2021-08-22 MED ORDER — NAPROXEN SODIUM 550 MG PO TABS: 550.0000 mg | ORAL_TABLET | Freq: Two times a day (BID) | ORAL | 0 refills | Status: AC

## 2021-08-22 MED ORDER — CYCLOBENZAPRINE HCL 10 MG PO TABS
5.0000 mg | ORAL_TABLET | Freq: Three times a day (TID) | ORAL | 0 refills | Status: AC | PRN
Start: 2021-08-22 — End: ?

## 2021-08-22 NOTE — Progress Notes (Signed)
Virtual Visit Consent   Mary Cunningham, you are scheduled for a virtual visit with a [facility] provider today.     Just as with appointments in the office, your consent must be obtained to participate.  Your consent will be active for this visit and any virtual visit you may have with one of our providers in the next 365 days.     If you have a MyChart account, a copy of this consent can be sent to you electronically.  All virtual visits are billed to your insurance company just like a traditional visit in the office.    As this is a virtual visit, video technology does not allow for your provider to perform a traditional examination.  This may limit your provider's ability to fully assess your condition.  If your provider identifies any concerns that need to be evaluated in person or the need to arrange testing (such as labs, EKG, etc.), we will make arrangements to do so.     Although advances in technology are sophisticated, we cannot ensure that it will always work on either your end or our end.  If the connection with a video visit is poor, the visit may have to be switched to a telephone visit.  With either a video or telephone visit, we are not always able to ensure that we have a secure connection.     I need to obtain your verbal consent now.   Are you willing to proceed with your visit today?    Mary Cunningham has provided verbal consent on 08/22/2021 for a virtual visit (video or telephone).   Margaretann Loveless, PA-C   Date: 08/22/2021 8:18 AM   Virtual Visit via Video Note   I, Margaretann Loveless, connected with  Mary Cunningham  ([id number], 1981-08-26) on 08/22/21 at  7:50 AM EST by a video-enabled telemedicine application and verified that I am speaking with the correct person using two identifiers.  Location: Patient: Virtual Visit Location Patient: Home Provider: Virtual Visit Location Provider: Home Office   I discussed the limitations of evaluation and  management by telemedicine and the availability of in person appointments. The patient expressed understanding and agreed to proceed.    History of Present Illness: Mary Cunningham is a 40 y.o. who identifies as a female who was assigned female at birth, and is being seen today for back pain.  HPI: Back Pain This is a recurrent problem. The current episode started more than 1 year ago (Symptoms have been intermittent and have been present over a year). The problem occurs intermittently. The problem has been gradually worsening since onset. The pain is present in the lumbar spine (lumbar spin and radiates to groin and hips). The quality of the pain is described as aching. The pain radiates to the left thigh. The pain is moderate. The pain is The same all the time. The symptoms are aggravated by twisting, bending and standing. Stiffness is present All day. Pertinent negatives include no numbness, perianal numbness or tingling. She has tried NSAIDs, walking and home exercises for the symptoms. The treatment provided mild relief.     Problems:  Patient Active Problem List   Diagnosis Date Noted   Insomnia 09/23/2019   Hyperlipidemia 03/31/2017   Preventative health care 02/07/2016   Irritable bowel syndrome (IBS)     Allergies: No Known Allergies Medications:  Current Outpatient Medications:    cyclobenzaprine (FLEXERIL) 10 MG tablet, Take 0.5-1 tablets (5-10 mg total)  by mouth 3 (three) times daily as needed for muscle spasms., Disp: 30 tablet, Rfl: 0   naproxen sodium (ANAPROX DS) 550 MG tablet, Take 1 tablet (550 mg total) by mouth 2 (two) times daily with a meal., Disp: 30 tablet, Rfl: 0   Multiple Vitamin (MULTIVITAMIN) tablet, Take 1 tablet by mouth daily., Disp: , Rfl:   Observations/Objective: Patient is well-developed, well-nourished in no acute distress.  Resting comfortably at home.  Head is normocephalic, atraumatic.  No labored breathing.  Speech is clear and coherent with  logical content.  Patient is alert and oriented at baseline.    Assessment and Plan: 1. Chronic bilateral low back pain without sciatica - naproxen sodium (ANAPROX DS) 550 MG tablet; Take 1 tablet (550 mg total) by mouth 2 (two) times daily with a meal.  Dispense: 30 tablet; Refill: 0 - cyclobenzaprine (FLEXERIL) 10 MG tablet; Take 0.5-1 tablets (5-10 mg total) by mouth 3 (three) times daily as needed for muscle spasms.  Dispense: 30 tablet; Refill: 0  - Suspect possible recurrent nerve impingement in upper lumbar area - Will treat with naproxen and flexeril - Home exercises provided via AVS - Continue heat and stretches - Advised to f/u with PCP or Orthopedic Urgent Care for further work up and imaging  Follow Up Instructions: I discussed the assessment and treatment plan with the patient. The patient was provided an opportunity to ask questions and all were answered. The patient agreed with the plan and demonstrated an understanding of the instructions.  A copy of instructions were sent to the patient via MyChart unless otherwise noted below.    The patient was advised to call back or seek an in-person evaluation if the symptoms worsen or if the condition fails to improve as anticipated.  Time:  I spent 12 minutes with the patient via telehealth technology discussing the above problems/concerns.    Margaretann Loveless, PA-C

## 2021-08-22 NOTE — Patient Instructions (Signed)
[facility] [address] [place], [place] [address] URGENT CARE HOURS Monday - Friday: 8:00am to 8:00pm Saturday: 10:00am to 3:00pm   Back Exercises The following exercises strengthen the muscles that help to support the trunk (torso) and back. They also help to keep the lower back flexible. Doing these exercises can help to prevent or lessen existing low back pain. If you have back pain or discomfort, try doing these exercises 2-3 times each day or as told by your health care provider. As your pain improves, do them once each day, but increase the number of times that you repeat the steps for each exercise (do more repetitions). To prevent the recurrence of back pain, continue to do these exercises once each day or as told by your health care provider. Do exercises exactly as told by your health care provider and adjust them as directed. It is normal to feel mild stretching, pulling, tightness, or discomfort as you do these exercises, but you should stop right away if you feel sudden pain or your pain gets worse. Exercises Single knee to chest Repeat these steps 3-5 times for each leg: Lie on your back on a firm bed or the floor with your legs extended. Bring one knee to your chest. Your other leg should stay extended and in contact with the floor. Hold your knee in place by grabbing your knee or thigh with both hands and hold. Pull on your knee until you feel a gentle stretch in your lower back or buttocks. Hold the stretch for 10-30 seconds. Slowly release and straighten your leg.  Pelvic tilt Repeat these steps 5-10 times: Lie on your back on a firm bed or the floor with your legs extended. Bend your knees so they are pointing toward the ceiling and your feet are flat on the floor. Tighten your lower abdominal muscles to press your lower back against the floor. This motion will tilt your pelvis so your tailbone points up toward the ceiling instead of pointing to your  feet or the floor. With gentle tension and even breathing, hold this position for 5-10 seconds.  Cat-cow Repeat these steps until your lower back becomes more flexible: Get into a hands-and-knees position on a firm bed or the floor. Keep your hands under your shoulders, and keep your knees under your hips. You may place padding under your knees for comfort. Let your head hang down toward your chest. Contract your abdominal muscles and point your tailbone toward the floor so your lower back becomes rounded like the back of a cat. Hold this position for 5 seconds. Slowly lift your head, let your abdominal muscles relax, and point your tailbone up toward the ceiling so your back forms a sagging arch like the back of a cow. Hold this position for 5 seconds.  Press-ups Repeat these steps 5-10 times: Lie on your abdomen (face-down) on a firm bed or the floor. Place your palms near your head, about shoulder-width apart. Keeping your back as relaxed as possible and keeping your hips on the floor, slowly straighten your arms to raise the top half of your body and lift your shoulders. Do not use your back muscles to raise your upper torso. You may adjust the placement of your hands to make yourself more comfortable. Hold this position for 5 seconds while you keep your back relaxed. Slowly return to lying flat on the floor.  Bridges Repeat these steps 10 times: Lie on your back on a firm bed or the floor.  Bend your knees so they are pointing toward the ceiling and your feet are flat on the floor. Your arms should be flat at your sides, next to your body. Tighten your buttocks muscles and lift your buttocks off the floor until your waist is at almost the same height as your knees. You should feel the muscles working in your buttocks and the back of your thighs. If you do not feel these muscles, slide your feet 1-2 inches (2.5-5 cm) farther away from your buttocks. Hold this position for 3-5  seconds. Slowly lower your hips to the starting position, and allow your buttocks muscles to relax completely. If this exercise is too easy, try doing it with your arms crossed over your chest. Abdominal crunches Repeat these steps 5-10 times: Lie on your back on a firm bed or the floor with your legs extended. Bend your knees so they are pointing toward the ceiling and your feet are flat on the floor. Cross your arms over your chest. Tip your chin slightly toward your chest without bending your neck. Tighten your abdominal muscles and slowly raise your torso high enough to lift your shoulder blades a tiny bit off the floor. Avoid raising your torso higher than that because it can put too much stress on your lower back and does not help to strengthen your abdominal muscles. Slowly return to your starting position.  Back lifts Repeat these steps 5-10 times: Lie on your abdomen (face-down) with your arms at your sides, and rest your forehead on the floor. Tighten the muscles in your legs and your buttocks. Slowly lift your chest off the floor while you keep your hips pressed to the floor. Keep the back of your head in line with the curve in your back. Your eyes should be looking at the floor. Hold this position for 3-5 seconds. Slowly return to your starting position.  Contact a health care provider if: Your back pain or discomfort gets much worse when you do an exercise. Your worsening back pain or discomfort does not lessen within 2 hours after you exercise. If you have any of these problems, stop doing these exercises right away. Do not do them again unless your health care provider says that you can. Get help right away if: You develop sudden, severe back pain. If this happens, stop doing the exercises right away. Do not do them again unless your health care provider says that you can. This information is not intended to replace advice given to you by your health care provider. Make sure  you discuss any questions you have with your health care provider. Document Revised: 03/07/2021 Document Reviewed: 11/23/2020 Elsevier Patient Education  2022 [employer].

## 2021-10-18 LAB — HM MAMMOGRAPHY

## 2021-10-18 LAB — HM PAP SMEAR: HPV, high-risk: NEGATIVE

## 2021-10-18 LAB — RESULTS CONSOLE HPV: CHL HPV: NEGATIVE

## 2021-10-23 ENCOUNTER — Encounter: Payer: Self-pay | Admitting: Family

## 2021-10-23 ENCOUNTER — Telehealth: Payer: Self-pay | Admitting: Family

## 2021-10-23 ENCOUNTER — Ambulatory Visit (HOSPITAL_BASED_OUTPATIENT_CLINIC_OR_DEPARTMENT_OTHER)
Admission: RE | Admit: 2021-10-23 | Discharge: 2021-10-23 | Disposition: A | Discharge status: Home / Self Care | Payer: 59 | Source: Ambulatory Visit | Attending: Family | Admitting: Family

## 2021-10-23 ENCOUNTER — Other Ambulatory Visit: Payer: Self-pay

## 2021-10-23 ENCOUNTER — Ambulatory Visit (INDEPENDENT_AMBULATORY_CARE_PROVIDER_SITE_OTHER): Payer: 59 | Admitting: Family

## 2021-10-23 VITALS — BP 117/62 | HR 83 | Temp 99.1°F | Resp 16 | Ht 64.0 in | Wt 196.0 lb

## 2021-10-23 DIAGNOSIS — M545 Low back pain, unspecified: Secondary | ICD-10-CM | POA: Diagnosis not present

## 2021-10-23 DIAGNOSIS — M542 Cervicalgia: Secondary | ICD-10-CM | POA: Insufficient documentation

## 2021-10-23 DIAGNOSIS — E559 Vitamin D deficiency, unspecified: Secondary | ICD-10-CM

## 2021-10-23 DIAGNOSIS — Z0000 Encounter for general adult medical examination without abnormal findings: Secondary | ICD-10-CM

## 2021-10-23 LAB — CBC WITH DIFFERENTIAL/PLATELET
Basophils Absolute: 0 10*3/uL (ref 0.0–0.1)
Basophils Relative: 0.6 % (ref 0.0–3.0)
Eosinophils Absolute: 0.1 10*3/uL (ref 0.0–0.7)
Eosinophils Relative: 1.7 % (ref 0.0–5.0)
HCT: 43.1 % (ref 36.0–46.0)
Hemoglobin: 14.1 g/dL (ref 12.0–15.0)
Lymphocytes Relative: 24.7 % (ref 12.0–46.0)
Lymphs Abs: 1.2 10*3/uL (ref 0.7–4.0)
MCHC: 32.7 g/dL (ref 30.0–36.0)
MCV: 89.4 fl (ref 78.0–100.0)
Monocytes Absolute: 0.4 10*3/uL (ref 0.1–1.0)
Monocytes Relative: 8.6 % (ref 3.0–12.0)
Neutro Abs: 3.2 10*3/uL (ref 1.4–7.7)
Neutrophils Relative %: 64.4 % (ref 43.0–77.0)
Platelets: 259 10*3/uL (ref 150.0–400.0)
RBC: 4.82 Mil/uL (ref 3.87–5.11)
RDW: 13.2 % (ref 11.5–15.5)
WBC: 5 10*3/uL (ref 4.0–10.5)

## 2021-10-23 LAB — VITAMIN D 25 HYDROXY (VIT D DEFICIENCY, FRACTURES): VITD: 30.4 ng/mL (ref 30.00–100.00)

## 2021-10-23 LAB — LIPID PANEL
Cholesterol: 167 mg/dL (ref 0–200)
HDL: 49.6 mg/dL (ref >39.00)
LDL Cholesterol: 104 mg/dL — ABNORMAL HIGH (ref 0–99)
NonHDL: 117.36
Total CHOL/HDL Ratio: 3
Triglycerides: 68 mg/dL (ref 0.0–149.0)
VLDL: 13.6 mg/dL (ref 0.0–40.0)

## 2021-10-23 LAB — COMPREHENSIVE METABOLIC PANEL
ALT: 11 U/L (ref 0–35)
AST: 15 U/L (ref 0–37)
Albumin: 4.4 g/dL (ref 3.5–5.2)
Alkaline Phosphatase: 73 U/L (ref 39–117)
BUN: 12 mg/dL (ref 6–23)
CO2: 28 mEq/L (ref 19–32)
Calcium: 10 mg/dL (ref 8.4–10.5)
Chloride: 104 mEq/L (ref 96–112)
Creatinine, Ser: 1.1 mg/dL (ref 0.40–1.20)
GFR: 63.76 mL/min (ref >60.00)
Glucose, Bld: 87 mg/dL (ref 70–99)
Potassium: 4.6 mEq/L (ref 3.5–5.1)
Sodium: 141 mEq/L (ref 135–145)
Total Bilirubin: 0.4 mg/dL (ref 0.2–1.2)
Total Protein: 7.3 g/dL (ref 6.0–8.3)

## 2021-10-23 LAB — TSH: TSH: 1.07 u[IU]/mL (ref 0.35–5.50)

## 2021-10-23 NOTE — Assessment & Plan Note (Signed)
Pap up to date. Will request copy of report from GYN. She is having a diagnostic mammo later today for a "lymph node" in her right axilla. Recommended covid bivalent booster.  Flu shot up to date. Requests screening labs and understands that these may not be covered by insurance.

## 2021-10-23 NOTE — Telephone Encounter (Signed)
Please call [facility]le[facility] Rhym[facility] office to request copy of pap smear.

## 2021-10-23 NOTE — Progress Notes (Signed)
Subjective:     Patient ID: Mary Cunningham, female    DOB: [birth year], 41 y.o.   MRN: [id number]  Chief Complaint  Patient presents with   Annual Exam    HPI Patient is in today for CPX.  Immunizations: flu up to date, States that she had moderna x 2, no booster  Diet: OK diet.   Exercise: not in routine Pap Smear: 12/03/16 Mammogram: will begin at 32.  Vision: up to date Dental: up to date  She reports chronic neck/back pain. Did not take the flexeril/naproxen that was prescribed at her video visit. She is interested in additional imaging. Denies associated numbness/weakness.   Health Maintenance Due  Topic Date Due   COVID-19 Vaccine (1) Never done   Hepatitis C Screening  Never done   PAP SMEAR-Modifier  12/04/2019    Past Medical History:  Diagnosis Date   Heart murmur    "as a child"   Hyperlipidemia 03/31/2017   Irritable bowel syndrome (IBS)    Preventative health care 02/07/2016   Status post primary low transverse cesarean section 12/12/2014    Past Surgical History:  Procedure Laterality Date   CESAREAN SECTION N/A 12/10/2014   Procedure: CESAREAN SECTION;  Surgeon: Waymon Amato, MD;  Location: [facility];  Service: Obstetrics;  Laterality: N/A;   WISDOM TOOTH EXTRACTION      Family History  Problem Relation Age of Onset   Hypertension Mother    Asthma Mother    GER disease Father    Sleep apnea Father    Hypertension Maternal Aunt    Sarcoidosis Maternal Aunt    Lung cancer Maternal Aunt    Stroke Maternal Aunt    Heart failure Maternal Grandmother    Hypertension Maternal Grandmother    Breast cancer Maternal Grandmother    Endometrial cancer Maternal Grandmother    Stroke Maternal Grandmother    Dementia Maternal Grandmother    Cancer Maternal Grandmother    Lung cancer Maternal Grandfather    Dementia Paternal Grandmother    Healthy Paternal Grandfather     Social History   Socioeconomic History   Marital status: Single    Spouse name:  Not on file   Number of children: Not on file   Years of education: Not on file   Highest education level: Not on file  Occupational History   Occupation: RN   Tobacco Use   Smoking status: Never   Smokeless tobacco: Never  Substance and Sexual Activity   Alcohol use: No   Drug use: No   Sexual activity: Yes    Birth control/protection: I.U.D.  Other Topics Concern   Not on file  Social History Narrative   Lives with husband and son   No major dietary restrictions   Works at [employer] 3-4 times a week   Social Determinants of Radio broadcast assistant Strain: Not on file  Food Insecurity: Not on file  Transportation Needs: Not on file  Physical Activity: Not on file  Stress: Not on file  Social Connections: Not on file  Intimate Partner Violence: Not on file    Outpatient Medications Prior to Visit  Medication Sig Dispense Refill   cyclobenzaprine (FLEXERIL) 10 MG tablet Take 0.5-1 tablets (5-10 mg total) by mouth 3 (three) times daily as needed for muscle spasms. 30 tablet 0   Multiple Vitamin (MULTIVITAMIN) tablet Take 1 tablet by mouth daily.     naproxen sodium (ANAPROX DS) 550 MG tablet Take  1 tablet (550 mg total) by mouth 2 (two) times daily with a meal. 30 tablet 0   No facility-administered medications prior to visit.    No Known Allergies  Review of Systems  HENT:  Positive for congestion (attributes to allergies). Negative for hearing loss.   Eyes:  Negative for blurred vision.  Respiratory:  Negative for shortness of breath.   Cardiovascular:  Negative for chest pain.  Gastrointestinal:  Negative for constipation and diarrhea.  Genitourinary:  Negative for dysuria and frequency.  Musculoskeletal:  Positive for back pain (low back into hips sometime.) and neck pain. Negative for joint pain and myalgias.      Objective:    Physical Exam  BP 117/62 (BP Location: Right Arm, Patient Position: Sitting, Cuff Size: Small)    Pulse 83    Temp 99.1  F (37.3 C) (Oral)    Resp 16    Ht _0  (1.626 m)    Wt 196 lb (88.9 kg)    SpO2 100%    BMI 33.64 kg/m  Wt Readings from Last 3 Encounters:  10/23/21 196 lb (88.9 kg)  09/22/19 192 lb (87.1 kg)  03/28/17 164 lb 9.6 oz (74.7 kg)   Physical Exam  Constitutional: She is oriented to person, place, and time. She appears well-developed and well-nourished. No distress.  HENT:  Head: Normocephalic and atraumatic.  Right Ear: Tympanic membrane and ear canal normal.  Left Ear: Tympanic membrane and ear canal normal.  Mouth/Throat: Not examined- pt wearing mask Eyes: Pupils are equal, round, and reactive to light. No scleral icterus.  Neck: Normal range of motion. No thyromegaly present.  Cardiovascular: Normal rate and regular rhythm.   No murmur heard. Pulmonary/Chest: Effort normal and breath sounds normal. No respiratory distress. He has no wheezes. She has no rales. She exhibits no tenderness.  Abdominal: Soft. Bowel sounds are normal. She exhibits no distension and no mass. There is no tenderness. There is no rebound and no guarding.  Musculoskeletal: She exhibits no edema.  Lymphadenopathy:    She has no cervical adenopathy.  Neurological: She is alert and oriented to person, place, and time. She has normal patellar reflexes. She exhibits normal muscle tone. Coordination normal.  Skin: Skin is warm and dry.  Psychiatric: She has a normal mood and affect. Her behavior is normal. Judgment and thought content normal.  Breast/pelvic: deferred      Assessment & Plan:        Assessment & Plan:   Problem List Items Addressed This Visit       Unprioritized   Preventative health care - Primary    Pap up to date. Will request copy of report from GYN. She is having a diagnostic mammo later today for a "lymph node" in her right axilla. Recommended covid bivalent booster.  Flu shot up to date. Requests screening labs and understands that these may not be covered by insurance.        Relevant Orders   Comp Met (CMET)   CBC with Differential/Platelet   TSH   Lipid panel   Other Visit Diagnoses     Bilateral low back pain, unspecified chronicity, unspecified whether sciatica present       Relevant Orders   Ambulatory referral to Physical Therapy   DG Lumbar Spine Complete   Neck pain       Relevant Orders   Ambulatory referral to Physical Therapy   DG Cervical Spine Complete   Vitamin D deficiency  Relevant Orders   VITAMIN D 25 Hydroxy (Vit-D Deficiency, Fractures)       I am having Mary Cunningham maintain her multivitamin, naproxen sodium, and cyclobenzaprine.  No orders of the defined types were placed in this encounter.

## 2021-10-23 NOTE — Patient Instructions (Signed)
Continue your work on healthy diet and regular exercise. Please get your covid booster and send me a photo of your covid card via mychart.

## 2021-10-24 NOTE — Telephone Encounter (Signed)
Release request will be faxed

## 2021-11-07 ENCOUNTER — Encounter: Payer: Self-pay | Admitting: Family

## 2021-11-07 ENCOUNTER — Ambulatory Visit: Payer: 59 | Admitting: Physical Therapy

## 2021-11-10 LAB — HM PAP SMEAR: HM Pap smear: POSITIVE

## 2021-11-23 ENCOUNTER — Ambulatory Visit: Payer: 59 | Admitting: Physical Therapy

## 2021-11-28 ENCOUNTER — Ambulatory Visit: Payer: 59 | Admitting: Physical Therapy

## 2021-11-30 ENCOUNTER — Other Ambulatory Visit: Payer: Self-pay

## 2021-11-30 ENCOUNTER — Encounter: Payer: Self-pay | Admitting: Physical Therapy

## 2021-11-30 ENCOUNTER — Ambulatory Visit: Payer: 59 | Attending: Family | Admitting: Physical Therapy

## 2021-11-30 DIAGNOSIS — R293 Abnormal posture: Secondary | ICD-10-CM | POA: Diagnosis present

## 2021-11-30 DIAGNOSIS — M542 Cervicalgia: Secondary | ICD-10-CM | POA: Diagnosis present

## 2021-11-30 DIAGNOSIS — M545 Low back pain, unspecified: Secondary | ICD-10-CM | POA: Diagnosis not present

## 2021-11-30 DIAGNOSIS — M62830 Muscle spasm of back: Secondary | ICD-10-CM | POA: Diagnosis present

## 2021-11-30 DIAGNOSIS — M6281 Muscle weakness (generalized): Secondary | ICD-10-CM | POA: Diagnosis present

## 2021-11-30 DIAGNOSIS — G8929 Other chronic pain: Secondary | ICD-10-CM
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
## 2021-12-04 ENCOUNTER — Ambulatory Visit: Payer: 59

## 2021-12-07 ENCOUNTER — Ambulatory Visit: Payer: 59 | Admitting: Physical Therapy

## 2021-12-07 ENCOUNTER — Other Ambulatory Visit: Payer: Self-pay

## 2021-12-07 ENCOUNTER — Encounter: Payer: Self-pay | Admitting: Physical Therapy

## 2021-12-07 DIAGNOSIS — M545 Low back pain, unspecified: Secondary | ICD-10-CM | POA: Diagnosis not present

## 2021-12-07 NOTE — Patient Instructions (Signed)
? ? ? ?  Access Code: [email or web address] ?URL: [email or web address] ?Date: 12/07/2021 ?Prepared by: Glenetta Hew ? ?Exercises ?Supine Piriformis Stretch with Foot on Ground - 2-3 x daily - 7 x weekly - 3 reps - 30 sec hold ?Supine Piriformis Stretch Pulling Heel to Hip - 2-3 x daily - 7 x weekly - 3 reps - 30 sec hold ?Supine Posterior Pelvic Tilt - 1 x daily - 7 x weekly - 2 sets - 10 reps - 5 sec hold ?Hooklying Isometric Clamshell - 1 x daily - 7 x weekly - 2 sets - 10 reps - 3 sec hold ?Bridge with Resistance - 1 x daily - 7 x weekly - 2 sets - 10 reps - 5 sec hold ? ?Patient Education ?Posture and Body Mechanics ?

## 2021-12-07 NOTE — Therapy (Signed)
[place] ?[facility] ?[address]  [address] ?[place], [place], [address] ?Phone: [phone]   Fax:  [phone] ? ?Physical Therapy Treatment ? ?Patient Details  ?Name: Mary Cunningham ?MRN: [id number] ?Date of Birth: 1981/09/24 ?Referring Provider (PT): Sandford Craze, NP ? ? ?Encounter Date: 12/07/2021 ? ? PT End of Session - 12/07/21 1623   ? ? Visit Number 2   ? Date for PT Re-Evaluation 01/25/22   ? Authorization Type Aetna - VL: [id number]   ? PT Start Time 1623   Pt arrived late  ? PT Stop Time 1701   ? PT Time Calculation (min) 38 min   ? Activity Tolerance Patient tolerated treatment well   ? Behavior During Therapy [facility] for tasks assessed/performed   ? ?  ?  ? ?  ? ? ?Past Medical History:  ?Diagnosis Date  ? Heart murmur   ? "as a child"  ? Hyperlipidemia 03/31/2017  ? Irritable bowel syndrome (IBS)   ? Preventative health care 02/07/2016  ? Status post primary low transverse cesarean section 12/12/2014  ? ? ?Past Surgical History:  ?Procedure Laterality Date  ? CESAREAN SECTION N/A 12/10/2014  ? Procedure: CESAREAN SECTION;  Surgeon: Hoover Browns, MD;  Location: [facility];  Service: Obstetrics;  Laterality: N/A;  ? WISDOM TOOTH EXTRACTION    ? ? ?There were no vitals filed for this visit. ? ? Subjective Assessment - 12/07/21 1626   ? ? Subjective Pt reports stiffness but denies pain today.   ? Diagnostic tests 10/23/21 - Lumbar x-ray:  No evidence for fracture or malalignment. Early degenerative changes at L5-S1.  Cervical x-ray: Negative cervical spine radiographs.   ? Patient Stated Goals "To get rid of the pain or figure out positions or stretch to minimize pain. Know what to do to prevent further degenerative changes."   ? Currently in Pain? No/denies   ? ?  ?  ? ?  ? ? ? ? ? ? ? ? ? ? ? ? ? ? ? ? ? ? ? ? [facility] Adult PT Treatment/Exercise - 12/07/21 1623   ? ?  ? Lumbar Exercises: Stretches  ? Single Knee to Chest Stretch Right;Left;1 rep;30 seconds   ? Piriformis  Stretch Right;Left;1 rep;30 seconds   ? Piriformis Stretch Limitations hooklying KTOS   ? Figure 4 Stretch 2 reps;30 seconds;Supine;With overpressure   ? Figure 4 Stretch Limitations figure-4 with overpressure and figure-4 heel to hip   ?  ? Lumbar Exercises: Aerobic  ? Nustep L4 x 6 min (UE/LE)   ?  ? Lumbar Exercises: Supine  ? Ab Set 10 reps;5 seconds   ? Pelvic Tilt 10 reps;5 seconds   ? Pelvic Tilt Limitations PPT   ? Clam 10 reps;3 seconds   ? Clam Limitations TrA/PPT + red TB alt bent-knee fallout   ? Bridge 10 reps;5 seconds   ? Bridge Limitations PPT roll-up lift into bridge + red TB hip ABD isometric   ? ?  ?  ? ?  ? ? ? ? ? ? ? ? ? ? PT Education - 12/07/21 1700   ? ? Education Details Initial HEP - Access Code: [email or web address];  Patient Education  Posture and Body Mechanics   ? Person(s) Educated Patient   ? Methods Explanation;Demonstration;Verbal cues;Tactile cues;Handout   ? Comprehension Verbalized understanding;Verbal cues required;Tactile cues required;Returned demonstration;Need further instruction   ? ?  ?  ? ?  ? ? ? PT Short Term Goals -  12/07/21 1628   ? ?  ? PT SHORT TERM GOAL #1  ? Title Patient will be independent with initial HEP   ? Status On-going   ? Target Date 12/28/21   ?  ? PT SHORT TERM GOAL #2  ? Title Patient will verbalize/demonstrate understanding of neutral spine posture and proper body mechanics to reduce strain on lumbar spine   ? Status On-going   ? Target Date 12/28/21   ? ?  ?  ? ?  ? ? ? ? PT Long Term Goals - 12/07/21 1628   ? ?  ? PT LONG TERM GOAL #1  ? Title Patient will demonstrate independent use of ongoing/advanced HEP to facilitate ability to maintain/progress functional gains from skilled physical therapy services   ? Status On-going   ? Target Date 01/25/22   ?  ? PT LONG TERM GOAL #2  ? Title Patient to demonstrate ability to achieve and maintain good spinal alignment/posturing and body mechanics needed for daily activities   ? Status On-going   ? Target Date  01/25/22   ?  ? PT LONG TERM GOAL #3  ? Title Patient to report reduction in frequency and intensity of neck pain and LBP by >/= 50-75% to allow for improved activity tolerance   ? Status On-going   ? Target Date 01/25/22   ?  ? PT LONG TERM GOAL #4  ? Title Patient to improve cervical and lumbar AROM to WNL without pain provocation   ? Status On-going   ? Target Date 01/25/22   ?  ? PT LONG TERM GOAL #5  ? Title Patient will demonstrate improved B proximal UE/LE strength to >/= 4+/5 for improved stability and ease of mobility   ? Status On-going   ? Target Date 01/25/22   ?  ? PT LONG TERM GOAL #6  ? Title Patient to report ability to perform ADLs, household, and work-related tasks without limitation due to neck or low back pain, LOM or weakness   ? Status On-going   ? Target Date 01/25/22   ? ?  ?  ? ?  ? ? ? ? ? ? ? ? Plan - 12/07/21 1628   ? ? Clinical Impression Statement Mary Cunningham reports no pain today, but states LBP is typically a bigger concern than her neck pain, therefore initial stretching and strengthening exercises focusing on lumbar spine with initial HEP provided based on patient response to attempted exercises. Due to time constraints 2? pt?s late arrival, deferred initial cervical HEP until next visit but did provide education on postural awareness and proper body mechanics for typical daily positioning and household tasks.   ? Comorbidities HLD, IBS, h/o low transverse C-section, insomnia   ? Rehab Potential Excellent   ? PT Frequency 1x / week   1-2x/wk - pt wishing to start 1x/wk  ? PT Duration 8 weeks   ? PT Treatment/Interventions ADLs/Self Care Home Management;Cryotherapy;Electrical Stimulation;Iontophoresis 4mg /ml Dexamethasone;Moist Heat;Traction;Ultrasound;Functional mobility training;Therapeutic activities;Therapeutic exercise;Neuromuscular re-education;Patient/family education;Manual techniques;Passive range of motion;Dry needling;Taping;Spinal Manipulations   ? PT Next Visit Plan Review  and expand on initial lumbar HEP and create initial HEP for cervical stretching & mobilization + core/postural strengthening; regular HEP updates due to 1x/wk frequency; MT and modalities as indicated for abnormal muscle tension, cervical/lumbar hypomobility and pain; posture and body mechanics education review PRN   ? PT Home Exercise Plan Access Code:   ? Consulted and Agree with Plan of Care Patient   ? ?  ?  ? ?  ? ? ?  Patient will benefit from skilled therapeutic intervention in order to improve the following deficits and impairments:  Decreased activity tolerance, Decreased mobility, Decreased range of motion, Decreased strength, Hypomobility, Increased fascial restricitons, Increased muscle spasms, Impaired perceived functional ability, Impaired flexibility, Improper body mechanics, Postural dysfunction, Pain ? ?Visit Diagnosis: ?Chronic bilateral low back pain without sciatica ? ?Cervicalgia ? ?Abnormal posture ? ?Muscle spasm of back ? ?Muscle weakness (generalized) ? ? ? ? ?Problem List ?Patient Active Problem List  ? Diagnosis Date Noted  ? Insomnia 09/23/2019  ? Hyperlipidemia 03/31/2017  ? Preventative health care 02/07/2016  ? Irritable bowel syndrome (IBS)   ? ? ?Marry GuanJoAnne M Margrette Wynia, PT ?12/07/2021, 7:45 PM ? ?[place] ?[facility] ?[address]  [address] ?[place], [place], [address] ?Phone: [phone]   Fax:  [phone] ? ?Name: Mary Cunningham ?MRN: [id number] ?Date of Birth: 1980/12/09 ? ? ? ?

## 2021-12-12 ENCOUNTER — Ambulatory Visit: Payer: 59 | Admitting: Physical Therapy

## 2021-12-14 ENCOUNTER — Encounter: Payer: 59 | Admitting: Physical Therapy

## 2021-12-21 ENCOUNTER — Ambulatory Visit: Payer: 59

## 2021-12-21 DIAGNOSIS — M545 Low back pain, unspecified: Secondary | ICD-10-CM

## 2021-12-21 DIAGNOSIS — G8929 Other chronic pain: Secondary | ICD-10-CM

## 2021-12-21 DIAGNOSIS — M6281 Muscle weakness (generalized): Secondary | ICD-10-CM

## 2021-12-21 DIAGNOSIS — M62830 Muscle spasm of back: Secondary | ICD-10-CM

## 2021-12-21 DIAGNOSIS — R293 Abnormal posture: Secondary | ICD-10-CM

## 2021-12-21 DIAGNOSIS — M542 Cervicalgia: Secondary | ICD-10-CM

## 2021-12-21 NOTE — Patient Instructions (Signed)
Access Code: [email or web address] ?URL: [email or web address] ?Date: 12/21/2021 ?Prepared by: Clarene Essex ? ?Exercises ?- Supine Piriformis Stretch with Foot on Ground  - 2-3 x daily - 7 x weekly - 3 reps - 30 sec hold ?- Supine Piriformis Stretch Pulling Heel to Hip  - 2-3 x daily - 7 x weekly - 3 reps - 30 sec hold ?- Supine Posterior Pelvic Tilt  - 1 x daily - 7 x weekly - 2 sets - 10 reps - 5 sec hold ?- Hooklying Isometric Clamshell  - 1 x daily - 7 x weekly - 2 sets - 10 reps - 3 sec hold ?- Bridge with Resistance  - 1 x daily - 7 x weekly - 2 sets - 10 reps - 5 sec hold ?- Standing Lumbar Extension at [place]  - 1 x daily - 7 x weekly - 3 sets - 10 reps ?- Shoulder Extension with Resistance  - 1 x daily - 7 x weekly - 3 sets - 10 reps ? ?Patient Education ?- Biomedical scientist ?

## 2021-12-21 NOTE — Therapy (Signed)
[place] ?[facility] ?[address]  [address] ?[place], [place], [address] ?Phone: [phone]   Fax:  [phone] ? ?Physical Therapy Treatment ? ?Patient Details  ?Name: Mary Cunningham ?MRN: [id number] ?Date of Birth: [birth year] ?Referring Provider (PT): Sandford CrazeMelissa O'Sullivan, NP ? ? ?Encounter Date: 12/21/2021 ? ? PT End of Session - 12/21/21 1532   ? ? Visit Number 3   ? Date for PT Re-Evaluation 01/25/22   ? Authorization Type Aetna - VL: 60   ? PT Start Time 1448   ? PT Stop Time 1529   ? PT Time Calculation (min) 41 min   ? Activity Tolerance Patient tolerated treatment well   ? [facility] tasks assessed/performed   ? ?  ?  ? ?  ? ? ?Past Medical History:  ?Diagnosis Date  ? Heart murmur   ? "as a child"  ? Hyperlipidemia 03/31/2017  ? Irritable bowel syndrome (IBS)   ? Preventative health care 02/07/2016  ? Status post primary low transverse cesarean section 12/12/2014  ? ? ?Past Surgical History:  ?Procedure Laterality Date  ? CESAREAN SECTION N/A 12/10/2014  ? Procedure: CESAREAN SECTION;  Surgeon: Hoover BrownsEma Kulwa, MD;  Location: [facility];  Service: Obstetrics;  Laterality: N/A;  ? WISDOM TOOTH EXTRACTION    ? ? ?There were no vitals filed for this visit. ? ? Subjective Assessment - 12/21/21 1448   ? ? Subjective Pt reports all is well, no pain today.   ? Diagnostic tests 10/23/21 - Lumbar x-ray:  No evidence for fracture or malalignment. Early degenerative changes at L5-S1.  Cervical x-ray: Negative cervical spine radiographs.   ? Patient Stated Goals "To get rid of the pain or figure out positions or stretch to minimize pain. Know what to do to prevent further degenerative changes."   ? Currently in Pain? No/denies   ? ?  ?  ? ?  ? ? ? ? ? ? ? ? ? ? ? ? ? ? ? ? ? ? ? ? [facility] Adult PT Treatment/Exercise - 12/21/21 0001   ? ?  ? Exercises  ? Exercises Lumbar   ?  ? Lumbar Exercises: Stretches  ? Standing Side Bend Right;Left   ? Standing Side Bend  Limitations 10x5"   ? Standing Extension 10 reps;5 seconds   ? Standing Extension Limitations at wall with arms supported   ? Piriformis Stretch Right;Left;1 rep;30 seconds   ? Piriformis Stretch Limitations hooklying KTOS   ? Other Lumbar Stretch Exercise childs pose x 30 sec   ?  ? Lumbar Exercises: Aerobic  ? Nustep L4 x 6 min (UE/LE)   ?  ? Lumbar Exercises: Standing  ? Row Strengthening;Both;20 reps;Theraband   ? Theraband Level (Row) Level 2 (Red)   ? Shoulder Extension Strengthening;Both;10 reps;Theraband   ? Theraband Level (Shoulder Extension) Level 2 (Red)   ? Shoulder Extension Limitations 2x10   ?  ? Lumbar Exercises: Supine  ? Bridge 5 seconds;15 reps   ? Bridge Limitations PPT roll-up lift into bridge + red TB hip ABD isometric   ?  ? Manual Therapy  ? Manual Therapy Soft tissue mobilization   ? Soft tissue mobilization STM to R glute med and lower lumbar paraspinals   ? ?  ?  ? ?  ? ? ? ? ? ? ? ? ? ? PT Education - 12/21/21 1602   ? ? Education Details HEP update: extension with RTB and lumbar  extension with wall support   ? Person(s) Educated Patient   ? Methods Explanation;Demonstration;Handout   ? Comprehension Verbalized understanding;Returned demonstration   ? ?  ?  ? ?  ? ? ? PT Short Term Goals - 12/07/21 1628   ? ?  ? PT SHORT TERM GOAL #1  ? Title Patient will be independent with initial HEP   ? Status On-going   ? Target Date 12/28/21   ?  ? PT SHORT TERM GOAL #2  ? Title Patient will verbalize/demonstrate understanding of neutral spine posture and proper body mechanics to reduce strain on lumbar spine   ? Status On-going   ? Target Date 12/28/21   ? ?  ?  ? ?  ? ? ? ? PT Long Term Goals - 12/07/21 1628   ? ?  ? PT LONG TERM GOAL #1  ? Title Patient will demonstrate independent use of ongoing/advanced HEP to facilitate ability to maintain/progress functional gains from skilled physical therapy services   ? Status On-going   ? Target Date 01/25/22   ?  ? PT LONG TERM GOAL #2  ? Title Patient  to demonstrate ability to achieve and maintain good spinal alignment/posturing and body mechanics needed for daily activities   ? Status On-going   ? Target Date 01/25/22   ?  ? PT LONG TERM GOAL #3  ? Title Patient to report reduction in frequency and intensity of neck pain and LBP by >/= 50-75% to allow for improved activity tolerance   ? Status On-going   ? Target Date 01/25/22   ?  ? PT LONG TERM GOAL #4  ? Title Patient to improve cervical and lumbar AROM to WNL without pain provocation   ? Status On-going   ? Target Date 01/25/22   ?  ? PT LONG TERM GOAL #5  ? Title Patient will demonstrate improved B proximal UE/LE strength to >/= 4+/5 for improved stability and ease of mobility   ? Status On-going   ? Target Date 01/25/22   ?  ? PT LONG TERM GOAL #6  ? Title Patient to report ability to perform ADLs, household, and work-related tasks without limitation due to neck or low back pain, LOM or weakness   ? Status On-going   ? Target Date 01/25/22   ? ?  ?  ? ?  ? ? ? ? ? ? ? ? Plan - 12/21/21 1607   ? ? Clinical Impression Statement Pt throughout session had reports of a tight spot on the lower back into the R glutes right below the iliac crest. Lumbar extension and side bends at the wall provided the most relief per pt. With cues and instruction provided during sesssion pt was able to complete all interventions. Palpated tenderness along the R glute med but good response to MT. Continue working on extension based exercises.   ? Personal Factors and Comorbidities Comorbidity 3+;Fitness;Past/Current Experience;Time since onset of injury/illness/exacerbation   ? Comorbidities HLD, IBS, h/o low transverse C-section, insomnia   ? PT Frequency 1x / week   1-2x per week  ? PT Duration 8 weeks   ? PT Treatment/Interventions ADLs/Self Care Home Management;Cryotherapy;Electrical Stimulation;Iontophoresis 4mg /ml Dexamethasone;Moist Heat;Traction;Ultrasound;Functional mobility training;Therapeutic activities;Therapeutic  exercise;Neuromuscular re-education;Patient/family education;Manual techniques;Passive range of motion;Dry needling;Taping;Spinal Manipulations   ? PT Next Visit Plan Review HEP and regular HEP updates due to 1x/wk frequency; MT and modalities as indicated for abnormal muscle tension, cervical/lumbar hypomobility and pain; posture and body mechanics education review PRN   ?  PT Home Exercise Plan Access Code: [email or web address]   ? Consulted and Agree with Plan of Care Patient   ? ?  ?  ? ?  ? ? ?Patient will benefit from skilled therapeutic intervention in order to improve the following deficits and impairments:  Decreased activity tolerance, Decreased mobility, Decreased range of motion, Decreased strength, Hypomobility, Increased fascial restricitons, Increased muscle spasms, Impaired perceived functional ability, Impaired flexibility, Improper body mechanics, Postural dysfunction, Pain ? ?Visit Diagnosis: ?Chronic bilateral low back pain without sciatica ? ?Cervicalgia ? ?Abnormal posture ? ?Muscle spasm of back ? ?Muscle weakness (generalized) ? ? ? ? ?Problem List ?Patient Active Problem List  ? Diagnosis Date Noted  ? Insomnia 09/23/2019  ? Hyperlipidemia 03/31/2017  ? Preventative health care 02/07/2016  ? Irritable bowel syndrome (IBS)   ? ? ?Darleene Cleaver, PTA ?12/21/2021, 4:36 PM ? ?[place] ?[facility] ?[address]  [address] ?[place], [place], [address] ?Phone: [phone]   Fax:  [phone] ? ?Name: Mary Cunningham ?MRN: [id number] ?Date of Birth: 1981/04/04 ? ? ? ?

## 2021-12-27 ENCOUNTER — Ambulatory Visit: Payer: 59 | Attending: Family

## 2021-12-27 DIAGNOSIS — G8929 Other chronic pain: Secondary | ICD-10-CM | POA: Diagnosis present

## 2021-12-27 DIAGNOSIS — M62830 Muscle spasm of back: Secondary | ICD-10-CM | POA: Diagnosis present

## 2021-12-27 DIAGNOSIS — M542 Cervicalgia: Secondary | ICD-10-CM

## 2021-12-27 DIAGNOSIS — M6281 Muscle weakness (generalized): Secondary | ICD-10-CM | POA: Diagnosis present

## 2021-12-27 DIAGNOSIS — M545 Low back pain, unspecified: Secondary | ICD-10-CM | POA: Insufficient documentation

## 2021-12-27 DIAGNOSIS — R293 Abnormal posture: Secondary | ICD-10-CM

## 2021-12-27 NOTE — Patient Instructions (Signed)
Access Code: [email or web address] ?URL: [email or web address] ?Date: 12/27/2021 ?Prepared by: Verta Ellen ? ?Exercises ?- Supine Piriformis Stretch Pulling Heel to Hip  - 2-3 x daily - 7 x weekly - 3 reps - 30 sec hold ?- Hooklying Isometric Clamshell  - 1 x daily - 7 x weekly - 2 sets - 10 reps - 3 sec hold ?- Bridge with Resistance  - 1 x daily - 7 x weekly - 2 sets - 10 reps - 5 sec hold ?- Standing Lumbar Extension at Wall - Forearms  - 1 x daily - 7 x weekly - 3 sets - 10 reps ?- Shoulder Extension with Resistance  - 1 x daily - 3-4 x weekly - 3 sets - 10 reps ?- Standing Hip Flexion with Resistance Loop  - 1 x daily - 3-4 x weekly - 3 sets - 10 reps ?- Hip Abduction with Resistance Loop  - 1 x daily - 3-4 x weekly - 3 sets - 10 reps ?- Hip Extension with Resistance Loop  - 1 x daily - 3-4 x weekly - 3 sets - 10 reps ? ?Patient Education ?- Hospital doctor ?

## 2021-12-27 NOTE — Therapy (Signed)
?[facility] ?[address] ?[place], [place], [address] ?Phone: [phone]   Fax:  [phone] ? ?Physical Therapy Treatment ? ?Patient Details  ?Name: Mary Cunningham ?MRN: [id number] ?Date of Birth: [birth year] ?Referring Provider (PT): Debbrah Alar, NP ? ? ?Encounter Date: 12/27/2021 ? ? PT End of Session - 12/27/21 1536   ? ? Visit Number 4   ? Date for PT Re-Evaluation 01/25/22   ? Authorization Type Aetna - VL: 60   ? PT Start Time 2563   pt late  ? PT Stop Time 8937   ? PT Time Calculation (min) 40 min   ? Activity Tolerance Patient tolerated treatment well   ? Behavior During Therapy [facility] for tasks assessed/performed   ? ?  ?  ? ?  ? ? ?Past Medical History:  ?Diagnosis Date  ? Heart murmur   ? "as a child"  ? Hyperlipidemia 03/31/2017  ? Irritable bowel syndrome (IBS)   ? Preventative health care 02/07/2016  ? Status post primary low transverse cesarean section 12/12/2014  ? ? ?Past Surgical History:  ?Procedure Laterality Date  ? CESAREAN SECTION N/A 12/10/2014  ? Procedure: CESAREAN SECTION;  Surgeon: Waymon Amato, MD;  Location: [facility];  Service: Obstetrics;  Laterality: N/A;  ? WISDOM TOOTH EXTRACTION    ? ? ?There were no vitals filed for this visit. ? ? Subjective Assessment - 12/27/21 1454   ? ? Subjective Pt reports that she has not done as much exercises as she would like.   ? Diagnostic tests 10/23/21 - Lumbar x-ray:  No evidence for fracture or malalignment. Early degenerative changes at L5-S1.  Cervical x-ray: Negative cervical spine radiographs.   ? Patient Stated Goals "To get rid of the pain or figure out positions or stretch to minimize pain. Know what to do to prevent further degenerative changes."   ? Currently in Pain? No/denies   ? ?  ?  ? ?  ? ? ? ? ? ? ? ? ? ? ? ? ? ? ? ? ? ? ? ? [place] Adult PT Treatment/Exercise - 12/27/21 0001   ? ?  ? Exercises  ? Exercises Knee/Hip   ?  ? Lumbar Exercises: Aerobic  ? T[place] 1.9 mph, 0.5  incline x 6 min   ? Recumbent Bike L3x 27mn   ?  ? Lumbar Exercises: Standing  ? Heel Raises 10 reps;2 seconds   ? Shoulder Extension Strengthening;Both;10 reps;Theraband   ? Theraband Level (Shoulder Extension) Level 3 (Green)   ?  ? Knee/Hip Exercises: Standing  ? Hip Flexion Stengthening;Both;10 reps;Knee straight   ? Hip Flexion Limitations red TB at ankles; counter support   ? Hip Abduction Stengthening;Both;10 reps;Knee straight   ? Abduction Limitations red TB at ankles; counter support   ? Hip Extension Stengthening;Both;10 reps;Knee straight   ? Extension Limitations red TB ankles; counter support   ? ?  ?  ? ?  ? ? ? ? ? ? ? ? ? ? PT Education - 12/27/21 1508   ? ? Education Details edu on seated desk posture; posture and body mechanics review, with handout given   ? Person(s) Educated Patient   ? Methods Explanation;Demonstration;Handout   ? Comprehension Verbalized understanding;Returned demonstration   ? ?  ?  ? ?  ? ? ? PT Short Term Goals - 12/27/21 1458   ? ?  ? PT SHORT TERM GOAL #1  ? Title Patient  will be independent with initial HEP   ? Status Achieved   12/27/21  ? Target Date 12/28/21   ?  ? PT SHORT TERM GOAL #2  ? Title Patient will verbalize/demonstrate understanding of neutral spine posture and proper body mechanics to reduce strain on lumbar spine   ? Status Achieved   12/27/21  ? Target Date 12/28/21   ? ?  ?  ? ?  ? ? ? ? PT Long Term Goals - 12/07/21 1628   ? ?  ? PT LONG TERM GOAL #1  ? Title Patient will demonstrate independent use of ongoing/advanced HEP to facilitate ability to maintain/progress functional gains from skilled physical therapy services   ? Status On-going   ? Target Date 01/25/22   ?  ? PT LONG TERM GOAL #2  ? Title Patient to demonstrate ability to achieve and maintain good spinal alignment/posturing and body mechanics needed for daily activities   ? Status On-going   ? Target Date 01/25/22   ?  ? PT LONG TERM GOAL #3  ? Title Patient to report reduction in frequency and  intensity of neck pain and LBP by >/= 50-75% to allow for improved activity tolerance   ? Status On-going   ? Target Date 01/25/22   ?  ? PT LONG TERM GOAL #4  ? Title Patient to improve cervical and lumbar AROM to WNL without pain provocation   ? Status On-going   ? Target Date 01/25/22   ?  ? PT LONG TERM GOAL #5  ? Title Patient will demonstrate improved B proximal UE/LE strength to >/= 4+/5 for improved stability and ease of mobility   ? Status On-going   ? Target Date 01/25/22   ?  ? PT LONG TERM GOAL #6  ? Title Patient to report ability to perform ADLs, household, and work-related tasks without limitation due to neck or low back pain, LOM or weakness   ? Status On-going   ? Target Date 01/25/22   ? ?  ?  ? ?  ? ? ? ? ? ? ? ? Plan - 12/27/21 1537   ? ? Clinical Impression Statement Pt responded well to treatment. Provided education on desk posture and updated HEP as pt will be gone on vacation next week. Cues with standing hip strengthening and instruction given on giving rest days to avoid tendinitis. Pt completed exercises w/o increase in pain. STGs now met.   ? Personal Factors and Comorbidities Comorbidity 3+;Fitness;Past/Current Experience;Time since onset of injury/illness/exacerbation   ? PT Frequency 1x / week   1-2x per week  ? PT Duration 8 weeks   ? PT Treatment/Interventions ADLs/Self Care Home Management;Cryotherapy;Electrical Stimulation;Iontophoresis 86m/ml Dexamethasone;Moist Heat;Traction;Ultrasound;Functional mobility training;Therapeutic activities;Therapeutic exercise;Neuromuscular re-education;Patient/family education;Manual techniques;Passive range of motion;Dry needling;Taping;Spinal Manipulations   ? PT Next Visit Plan Review HEP and regular HEP updates due to 1x/wk frequency; continue progressing core and hip strengthening MT and modalities as indicated for abnormal muscle tension, cervical/lumbar hypomobility and pain;   ? PT Home Exercise Plan Access Code: [email or web address]  ? Consulted and  Agree with Plan of Care Patient   ? ?  ?  ? ?  ? ? ?Patient will benefit from skilled therapeutic intervention in order to improve the following deficits and impairments:  Decreased activity tolerance, Decreased mobility, Decreased range of motion, Decreased strength, Hypomobility, Increased fascial restricitons, Increased muscle spasms, Impaired perceived functional ability, Impaired flexibility, Improper body mechanics, Postural dysfunction, Pain ? ?Visit Diagnosis: ?Chronic bilateral low  back pain without sciatica ? ?Cervicalgia ? ?Abnormal posture ? ?Muscle spasm of back ? ?Muscle weakness (generalized) ? ? ? ? ?Problem List ?Patient Active Problem List  ? Diagnosis Date Noted  ? Insomnia 09/23/2019  ? Hyperlipidemia 03/31/2017  ? Preventative health care 02/07/2016  ? Irritable bowel syndrome (IBS)   ? ? ?Artist Pais, PTA ?12/27/2021, 5:19 PM ? ?[place] ?[facility] ?[address] ?[place], [place], [address] ?Phone: [phone]   Fax:  [phone] ? ?Name: ROANNE HAYE ?MRN: [id number] ?Date of Birth: June 07, 1981 ? ? ? ?

## 2022-01-11 ENCOUNTER — Ambulatory Visit: Payer: 59 | Admitting: Physical Therapy

## 2022-01-11 ENCOUNTER — Encounter: Payer: Self-pay | Admitting: Physical Therapy

## 2022-01-11 DIAGNOSIS — M62830 Muscle spasm of back: Secondary | ICD-10-CM

## 2022-01-11 DIAGNOSIS — R293 Abnormal posture: Secondary | ICD-10-CM

## 2022-01-11 DIAGNOSIS — M545 Low back pain, unspecified: Secondary | ICD-10-CM | POA: Diagnosis not present

## 2022-01-11 DIAGNOSIS — G8929 Other chronic pain: Secondary | ICD-10-CM

## 2022-01-11 DIAGNOSIS — M6281 Muscle weakness (generalized): Secondary | ICD-10-CM

## 2022-01-11 DIAGNOSIS — M542 Cervicalgia: Secondary | ICD-10-CM

## 2022-01-11 NOTE — Patient Instructions (Signed)
? ?  Access Code: [email or web address] ?URL: [email or web address] ?Date: 01/11/2022 ?Prepared by: Glenetta Hew ? ?Exercises ?- Supine Piriformis Stretch Pulling Heel to Hip  - 2-3 x daily - 7 x weekly - 3 reps - 30 sec hold ?- Standing Lumbar Extension at Wall - Forearms  - 1 x daily - 7 x weekly - 3 sets - 10 reps ?- Shoulder Extension with Resistance  - 1 x daily - 3-4 x weekly - 3 sets - 10 reps ?- Standing Hip Flexion with Resistance Loop  - 1 x daily - 3-4 x weekly - 3 sets - 10 reps ?- Hip Abduction with Resistance Loop  - 1 x daily - 3-4 x weekly - 3 sets - 10 reps ?- Hip Extension with Resistance Loop  - 1 x daily - 3-4 x weekly - 3 sets - 10 reps ?- Bridge with Hip Abduction and Resistance  - 1 x daily - 3-4 x weekly - 2 sets - 10 reps - 5 sec hold ?- Clamshell with Resistance  - 1 x daily - 3-4 x weekly - 2 sets - 10 reps - 3-5 sec hold ?- Standing Anti-Rotation Press with Anchored Resistance  - 1 x daily - 3-4 x weekly - 2 sets - 10 reps - 3 sec hold ?- Standing Trunk Rotation with Resistance  - 1 x daily - 3-4 x weekly - 2 sets - 10 reps - 3 sec hold ? ?Patient Education ?- Hospital doctor ?

## 2022-01-11 NOTE — Therapy (Signed)
[place] ?[facility] ?[address] ?[place], [place], [address] ?Phone: [phone]   Fax:  [phone] ? ?Physical Therapy Treatment / Progress Note / Recert ? ?Patient Details  ?Name: Mary Cunningham ?MRN: [id number] ?Date of Birth: 1981/09/01 ?Referring Provider (PT): Debbrah Alar, NP ? ?Progress Note ? ?Reporting Period 11/30/2021 to 01/11/2022 ? ?See note below for Objective Data and Assessment of Progress/Goals.  ? ? ? ?Encounter Date: 01/11/2022 ? ? PT End of Session - 01/11/22 1538   ? ? Visit Number 5   ? Date for PT Re-Evaluation 02/08/22   ? Authorization Type Aetna - VL: [id number]   ? PT Start Time 2500   ? PT Stop Time 1623   ? PT Time Calculation (min) 45 min   ? Activity Tolerance Patient tolerated treatment well   ? Behavior During Therapy [facility] for tasks assessed/performed   ? ?  ?  ? ?  ? ? ?Past Medical History:  ?Diagnosis Date  ? Heart murmur   ? "as a child"  ? Hyperlipidemia 03/31/2017  ? Irritable bowel syndrome (IBS)   ? Preventative health care 02/07/2016  ? Status post primary low transverse cesarean section 12/12/2014  ? ? ?Past Surgical History:  ?Procedure Laterality Date  ? CESAREAN SECTION N/A 12/10/2014  ? Procedure: CESAREAN SECTION;  Surgeon: Waymon Amato, MD;  Location: [facility];  Service: Obstetrics;  Laterality: N/A;  ? WISDOM TOOTH EXTRACTION    ? ? ?There were no vitals filed for this visit. ? ? Subjective Assessment - 01/11/22 1541   ? ? Subjective Pt denies pain but still having some tightness. She reports her back did better than she thought it would while on vacation. Has not done her HEP since before she went on vacation but no issues at that time.   ? Diagnostic tests 10/23/21 - Lumbar x-ray:  No evidence for fracture or malalignment. Early degenerative changes at L5-S1.  Cervical x-ray: Negative cervical spine radiographs.   ? Patient Stated Goals "To get rid of the pain or figure out positions or stretch to minimize pain. Know  what to do to prevent further degenerative changes."   ? Currently in Pain? No/denies   ? ?  ?  ? ?  ? ? ? ? ? [facility] PT Assessment - 01/11/22 1538   ? ?  ? Assessment  ? Medical Diagnosis Chronic L>R LBP w/o sciatica; Neck pain   ? Referring Provider (PT) Debbrah Alar, NP   ? Onset Date/Surgical Date --   ~1 yr for back, ~Nov 2022 for neck  ? Next MD Visit none   ?  ? Prior Function  ? Level of Independence Independent   ? Vocation Full time employment   ? 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 Leisure playing with son, watching movies, relaxing, no regular exercise in ~6 months   ?  ? AROM  ? Cervical Flexion 62   ? Cervical Extension 56   ? Cervical - Right Side Bend 50   ? Cervical - Left Side Bend 52   ? Cervical - Right Rotation 67   ? Cervical - Left Rotation 62   ? Lumbar Flexion fingertips to floor - tightness   ? Lumbar Extension WNL   ? Lumbar - Right Side Bend hand to mid calf - tight   ? Lumbar - Left Side Bend hand to mid calf - tight   ?  Lumbar - Right Rotation WNL   ? Lumbar - Left Rotation WNL - mild tightness reported   ?  ? Strength  ? Right Shoulder Flexion 4+/5   ? Right Shoulder Extension 5/5   ? Right Shoulder ABduction 4+/5   ? Right Shoulder Internal Rotation 5/5   ? Right Shoulder External Rotation 5/5   ? Left Shoulder Flexion 4+/5   ? Left Shoulder Extension 5/5   ? Left Shoulder ABduction 4+/5   ? Left Shoulder Internal Rotation 5/5   ? Left Shoulder External Rotation 5/5   ? Right Hip Flexion 5/5   ? Right Hip Extension 4+/5   ? Right Hip External Rotation  4+/5   ? Right Hip Internal Rotation 5/5   ? Right Hip ABduction 4/5   ? Right Hip ADduction 4+/5   ? Left Hip Flexion 5/5   ? Left Hip Extension 4+/5   ? Left Hip External Rotation 4+/5   ? Left Hip Internal Rotation 4+/5   ? Left Hip ABduction 4+/5   ? Left Hip ADduction 4+/5   ? ?  ?  ? ?  ? ? ? ? ? ? ? ? ? ? ? ? ? ? ? ? [facility] PT Treatment/Exercise - 01/11/22 1538   ? ?  ?  Lumbar Exercises: Aerobic  ? Elliptical L2.0 x 6 min   ?  ? Lumbar Exercises: Standing  ? Row Both;10 reps;Strengthening;Theraband   ? Theraband Level (Row) Level 3 (Green)   ? Row Limitations staggered stance to increase abdominal muscle activation with cues for PPT   ? Shoulder Extension Both;10 reps;Strengthening;Theraband   ? Theraband Level (Shoulder Extension) Level 3 (Green)   ? Shoulder Extension Limitations staggered stance to increase abdominal muscle activation with cues for PPT   ? Other Standing Lumbar Exercises R/L green TB pallof press x 10   ? Other Standing Lumbar Exercises R/L green TB pallof press + short arc trunk rotation x 10   ?  ? Lumbar Exercises: Supine  ? Bridge with [employer] Limitations demonstrated brideg progression to include hip abduction/clam   ?  ? Lumbar Exercises: Sidelying  ? Clam Limitations demonstrated progression of supine clam to sidelying adding gravity resistance while still using TB   ? ?  ?  ? ?  ? ? ? ? ? ? ? ? ? ? PT Education - 01/11/22 1621   ? ? Education Details HEP progression   ? Person(s) Educated Patient   ? Methods Explanation;Demonstration;Verbal cues;Handout   ? Comprehension Verbalized understanding;Verbal cues required;Returned demonstration;Need further instruction   ? ?  ?  ? ?  ? ? ? PT Short Term Goals - 12/27/21 1458   ? ?  ? PT SHORT TERM GOAL #1  ? Title Patient will be independent with initial HEP   ? Status Achieved   12/27/21  ? Target Date 12/28/21   ?  ? PT SHORT TERM GOAL #2  ? Title Patient will verbalize/demonstrate understanding of neutral spine posture and proper body mechanics to reduce strain on lumbar spine   ? Status Achieved   12/27/21  ? Target Date 12/28/21   ? ?  ?  ? ?  ? ? ? ? PT Long Term Goals - 01/11/22 1542   ? ?  ? PT LONG TERM GOAL #1  ? Title Patient will demonstrate independent use of ongoing/advanced HEP to facilitate ability to maintain/progress functional gains from skilled physical therapy services   ?  Status On-going    ? Target Date 02/08/22   ?  ? PT LONG TERM GOAL #2  ? Title Patient to demonstrate ability to achieve and maintain good spinal alignment/posturing and body mechanics needed for daily activities   ? Status Partially Met   ? Target Date 02/08/22   ?  ? PT LONG TERM GOAL #3  ? Title Patient to report reduction in frequency and intensity of neck pain and LBP by >/= 50-75% to allow for improved activity tolerance   ? Status On-going   01/11/22 - Pt reports 20-30% improvement in pain  ? Target Date 02/08/22   ?  ? PT LONG TERM GOAL #4  ? Title Patient to improve cervical and lumbar AROM to WNL without pain provocation   ? Status Partially Met   ? Target Date 02/08/22   ?  ? PT LONG TERM GOAL #5  ? Title Patient will demonstrate improved B proximal UE/LE strength to >/= 4+/5 for improved stability and ease of mobility   ? Status Partially Met   ? Target Date 02/08/22   ?  ? PT LONG TERM GOAL #6  ? Title Patient to report ability to perform ADLs, household, and work-related tasks without limitation due to neck or low back pain, LOM or weakness   ? Status Partially Met   01/11/22 - only really noting pain/tension when leaning forward to do things such as washing her hands, otherwise denies limitation with most daily tasks  ? Target Date 02/08/22   ?  ? PT LONG TERM GOAL #7  ? Title Patient will improve lumbar FOTO to >/= 66 to demonstrate improving function   ? Status New   ? Target Date 02/08/22   ? ?  ?  ? ?  ? ? ? ? ? ? ? ? Plan - 01/11/22 1623   ? ? Clinical Impression Statement Shirlee reports 20-30% improvement in her pain, noting neck pain is not much of an issue currently but low back pain still limits her with forward bending/leaning activities. She reports she managed well while on vacation but did have to stop and stretch at times, although she did not attempt her HEP while on vacation. Cervical ROM now WFL/WNL with only very slight limitations in B rotation, and B shoulder strength now 4+ to 5/5. Lumbar ROM also  WFL but some tightness still reported in flexion and L rotation. Proximal LE strength improving with greatest weakness noted in R hip abduction. HEP partially reviewed today, progressing rows and retraction/

## 2022-01-22 ENCOUNTER — Ambulatory Visit: Payer: 59 | Attending: Family

## 2022-01-22 DIAGNOSIS — M6281 Muscle weakness (generalized): Secondary | ICD-10-CM | POA: Insufficient documentation

## 2022-01-22 DIAGNOSIS — M542 Cervicalgia: Secondary | ICD-10-CM | POA: Insufficient documentation

## 2022-01-22 DIAGNOSIS — M62830 Muscle spasm of back: Secondary | ICD-10-CM | POA: Insufficient documentation

## 2022-01-22 DIAGNOSIS — M545 Low back pain, unspecified: Secondary | ICD-10-CM | POA: Insufficient documentation

## 2022-01-22 DIAGNOSIS — G8929 Other chronic pain: Secondary | ICD-10-CM | POA: Insufficient documentation

## 2022-01-22 DIAGNOSIS — R293 Abnormal posture: Secondary | ICD-10-CM | POA: Insufficient documentation

## 2022-01-31 ENCOUNTER — Ambulatory Visit: Payer: 59

## 2022-01-31 DIAGNOSIS — M6281 Muscle weakness (generalized): Secondary | ICD-10-CM

## 2022-01-31 DIAGNOSIS — M545 Low back pain, unspecified: Secondary | ICD-10-CM | POA: Diagnosis present

## 2022-01-31 DIAGNOSIS — G8929 Other chronic pain: Secondary | ICD-10-CM

## 2022-01-31 DIAGNOSIS — M62830 Muscle spasm of back: Secondary | ICD-10-CM | POA: Diagnosis present

## 2022-01-31 DIAGNOSIS — R293 Abnormal posture: Secondary | ICD-10-CM

## 2022-01-31 DIAGNOSIS — M542 Cervicalgia: Secondary | ICD-10-CM | POA: Diagnosis present

## 2022-01-31 NOTE — Therapy (Signed)
[place] ?[facility] ?[address] ?[place], [place], [address] ?Phone: [phone]   Fax:  [phone] ? ?Physical Therapy Treatment ? ?Patient Details  ?Name: Mary Cunningham ?MRN: [id number] ?Date of Birth: 08-15-1981 ?Referring Provider (PT): Debbrah Alar, NP ? ? ?Encounter Date: 01/31/2022 ? ? PT End of Session - 01/31/22 0855   ? ? Visit Number 6   ? Date for PT Re-Evaluation 02/08/22   ? Authorization Type Aetna - VL: [id number]   ? PT Start Time 0805   ? PT Stop Time 0848   ? PT Time Calculation (min) 43 min   ? Activity Tolerance Patient tolerated treatment well   ? Behavior During Therapy [facility] for tasks assessed/performed   ? ?  ?  ? ?  ? ? ?Past Medical History:  ?Diagnosis Date  ? Heart murmur   ? "as a child"  ? Hyperlipidemia 03/31/2017  ? Irritable bowel syndrome (IBS)   ? Preventative health care 02/07/2016  ? Status post primary low transverse cesarean section 12/12/2014  ? ? ?Past Surgical History:  ?Procedure Laterality Date  ? CESAREAN SECTION N/A 12/10/2014  ? Procedure: CESAREAN SECTION;  Surgeon: Waymon Amato, MD;  Location: [facility];  Service: Obstetrics;  Laterality: N/A;  ? WISDOM TOOTH EXTRACTION    ? ? ?There were no vitals filed for this visit. ? ? Subjective Assessment - 01/31/22 0856   ? ? Subjective Pt reports no pain but some days it bothers her more than others.   ? Diagnostic tests 10/23/21 - Lumbar x-ray:  No evidence for fracture or malalignment. Early degenerative changes at L5-S1.  Cervical x-ray: Negative cervical spine radiographs.   ? Patient Stated Goals "To get rid of the pain or figure out positions or stretch to minimize pain. Know what to do to prevent further degenerative changes."   ? Currently in Pain? No/denies   ? ?  ?  ? ?  ? ? ? ? ? [facility] PT Assessment - 01/31/22 0001   ? ?  ? Observation/Other Assessments  ? Focus on Therapeutic Outcomes (FOTO)  Lumbar = 63, predicted D/C FS = 66   ? ?  ?  ? ?   ? ? ? ? ? ? ? ? ? ? ? ? ? ? ? ? [place] Adult PT Treatment/Exercise - 01/31/22 0001   ? ?  ? Lumbar Exercises: Aerobic  ? UBE (Upper Arm Bike) L1.0 14mn fwd/342m back   ?  ? Lumbar Exercises: Supine  ? Other Supine Lumbar Exercises ball squeezes with bil KTC 2x10   ?  ? Knee/Hip Exercises: Standing  ? Hip Flexion Stengthening;Both;10 reps;Knee straight   ? Hip Flexion Limitations green TB at ankles; UE support   ? Forward Lunges Both;10 reps   ? Forward Lunges Limitations with slider   ? Hip Abduction Stengthening;Both;10 reps;Knee straight   ? Abduction Limitations green TB at ankles, UE support   ? Hip Extension Stengthening;Both;10 reps;Knee straight   ? Extension Limitations green TB at ankles; UE support   ?  ? Knee/Hip Exercises: Supine  ? Bridges Strengthening;Both;10 reps   ? Bridges Limitations green TB   ?  ? Knee/Hip Exercises: Sidelying  ? Hip ABduction Strengthening;Both;10 reps;AROM   ? Clams GTB 10x2" bil   ?  ? Knee/Hip Exercises: Prone  ? Other Prone Exercises glute kickbacks in quadruped x 10   ? ?  ?  ? ?  ? ? ? ? ? ? ? ? ? ? ? ?  PT Short Term Goals - 12/27/21 1458   ? ?  ? PT SHORT TERM GOAL #1  ? Title Patient will be independent with initial HEP   ? Status Achieved   12/27/21  ? Target Date 12/28/21   ?  ? PT SHORT TERM GOAL #2  ? Title Patient will verbalize/demonstrate understanding of neutral spine posture and proper body mechanics to reduce strain on lumbar spine   ? Status Achieved   12/27/21  ? Target Date 12/28/21   ? ?  ?  ? ?  ? ? ? ? PT Long Term Goals - 01/31/22 0856   ? ?  ? PT LONG TERM GOAL #1  ? Title Patient will demonstrate independent use of ongoing/advanced HEP to facilitate ability to maintain/progress functional gains from skilled physical therapy services   ? Status On-going   ? Target Date 02/08/22   ?  ? PT LONG TERM GOAL #2  ? Title Patient to demonstrate ability to achieve and maintain good spinal alignment/posturing and body mechanics needed for daily activities   ? Status  Partially Met   ? Target Date 02/08/22   ?  ? PT LONG TERM GOAL #3  ? Title Patient to report reduction in frequency and intensity of neck pain and LBP by >/= 50-75% to allow for improved activity tolerance   ? Status On-going   01/11/22 - Pt reports 20-30% improvement in pain  ? Target Date 02/08/22   ?  ? PT LONG TERM GOAL #4  ? Title Patient to improve cervical and lumbar AROM to WNL without pain provocation   ? Status Partially Met   ? Target Date 02/08/22   ?  ? PT LONG TERM GOAL #5  ? Title Patient will demonstrate improved B proximal UE/LE strength to >/= 4+/5 for improved stability and ease of mobility   ? Status Partially Met   ? Target Date 02/08/22   ?  ? PT LONG TERM GOAL #6  ? Title Patient to report ability to perform ADLs, household, and work-related tasks without limitation due to neck or low back pain, LOM or weakness   ? Status Partially Met   01/11/22 - only really noting pain/tension when leaning forward to do things such as washing her hands, otherwise denies limitation with most daily tasks  ? Target Date 02/08/22   ?  ? PT LONG TERM GOAL #7  ? Title Patient will improve lumbar FOTO to >/= 66 to demonstrate improving function   ? Status On-going   01/31/22- 63%  ? Target Date 02/08/22   ? ?  ?  ? ?  ? ? ? ? ? ? ? ? Plan - 01/31/22 0857   ? ? Clinical Impression Statement Pt showed a good progression through exercises today. Focused on glute strengthening in various positions to help with lumbar support. Reviewed bridges and clams and progressed these exercises with GTB. FOTO score has improved to 63%. Pt showed reports good understanding of HEP and with the exercises we reviewed. She no increased pain during this visit and may be ready to transition to HEP next visit.   ? Personal Factors and Comorbidities Comorbidity 3+;Fitness;Past/Current Experience;Time since onset of injury/illness/exacerbation   ? Comorbidities HLD, IBS, h/o low transverse C-section, insomnia   ? PT Frequency 1x / week    1-2x per week  ? PT Duration 8 weeks   ? PT Treatment/Interventions ADLs/Self Care Home Management;Cryotherapy;Electrical Stimulation;Iontophoresis 4mg /ml Dexamethasone;Moist Heat;Traction;Ultrasound;Functional mobility training;Therapeutic activities;Therapeutic exercise;Neuromuscular re-education;Patient/family  education;Manual techniques;Passive range of motion;Dry needling;Taping;Spinal Manipulations   ? PT Next Visit Plan Review HEP and regular HEP updates due to 1x/wk frequency; continue progressing core and hip strengthening; MT and modalities as indicated for abnormal muscle tension, cervical/lumbar hypomobility and pain   ? PT Home Exercise Plan Access Code: [email or web address]   ? Consulted and Agree with Plan of Care Patient   ? ?  ?  ? ?  ? ? ?Patient will benefit from skilled therapeutic intervention in order to improve the following deficits and impairments:  Decreased activity tolerance, Decreased mobility, Decreased range of motion, Decreased strength, Hypomobility, Increased fascial restricitons, Increased muscle spasms, Impaired perceived functional ability, Impaired flexibility, Improper body mechanics, Postural dysfunction, Pain ? ?Visit Diagnosis: ?Chronic bilateral low back pain without sciatica ? ?Cervicalgia ? ?Abnormal posture ? ?Muscle spasm of back ? ?Muscle weakness (generalized) ? ? ? ? ?Problem List ?Patient Active Problem List  ? Diagnosis Date Noted  ? Insomnia 09/23/2019  ? Hyperlipidemia 03/31/2017  ? Preventative health care 02/07/2016  ? Irritable bowel syndrome (IBS)   ? ? ?Artist Pais, PTA ?01/31/2022, 9:07 AM ? ?[facility] ?[facility] ?[address] ?[place], [place], [address] ?Phone: [phone]   Fax:  [phone] ? ?Name: KARIMAH WINQUIST ?MRN: [id number] ?Date of Birth: Aug 19, 1981 ? ? ? ?

## 2022-02-08 ENCOUNTER — Ambulatory Visit: Payer: 59

## 2022-02-08 DIAGNOSIS — M545 Low back pain, unspecified: Secondary | ICD-10-CM | POA: Diagnosis not present

## 2022-02-08 DIAGNOSIS — R293 Abnormal posture: Secondary | ICD-10-CM

## 2022-02-08 DIAGNOSIS — M542 Cervicalgia: Secondary | ICD-10-CM

## 2022-02-08 DIAGNOSIS — M6281 Muscle weakness (generalized): Secondary | ICD-10-CM

## 2022-02-08 DIAGNOSIS — M62830 Muscle spasm of back: Secondary | ICD-10-CM

## 2022-02-08 NOTE — Therapy (Addendum)
[facility] [address]  [place], [place], [address] Phone: [phone]   Fax:  [phone]  Physical Therapy Treatment / Discharge Summary  Patient Details  Name: Mary Cunningham MRN: [id number] Date of Birth: 26-Apr-1981 Referring Provider (PT): Debbrah Alar, NP   Encounter Date: 02/08/2022   PT End of Session - 02/08/22 1525     Visit Number 7    Date for PT Re-Evaluation 02/08/22    Authorization Type [employer] - VL: 51    PT Start Time 1450    PT Stop Time 1524    PT Time Calculation (min) 34 min    Activity Tolerance Patient tolerated treatment well    Behavior During Therapy [facility] for tasks assessed/performed             Past Medical History:  Diagnosis Date   Heart murmur    "as a child"   Hyperlipidemia 03/31/2017   Irritable bowel syndrome (IBS)    Preventative health care 02/07/2016   Status post primary low transverse cesarean section 12/12/2014    Past Surgical History:  Procedure Laterality Date   CESAREAN SECTION N/A 12/10/2014   Procedure: CESAREAN SECTION;  Surgeon: Waymon Amato, MD;  Location: [facility];  Service: Obstetrics;  Laterality: N/A;   WISDOM TOOTH EXTRACTION      There were no vitals filed for this visit.   Subjective Assessment - 02/08/22 1457     Subjective Pt reports that she has been doing well, her pain is not as often as it used to be.    Diagnostic tests 10/23/21 - Lumbar x-ray:  No evidence for fracture or malalignment. Early degenerative changes at L5-S1.  Cervical x-ray: Negative cervical spine radiographs.    Patient Stated Goals "To get rid of the pain or figure out positions or stretch to minimize pain. Know what to do to prevent further degenerative changes."    Currently in Pain? No/denies                [facility] PT Assessment - 02/08/22 0001       Observation/Other Assessments   Focus on Therapeutic Outcomes (FOTO)  Lumbar = 65, predicted D/C FS = 66       AROM   Cervical Flexion WNL    Cervical Extension WNL    Cervical - Right Side Bend WNL    Cervical - Left Side Bend WNL    Cervical - Right Rotation WNL    Cervical - Left Rotation WNL    Lumbar Flexion WNL    Lumbar Extension WNL    Lumbar - Right Side Bend WNL    Lumbar - Left Side Bend WNL    Lumbar - Right Rotation WNL    Lumbar - Left Rotation WNL      Strength   Right Hip ABduction 4+/5                           [facility] Adult PT Treatment/Exercise - 02/08/22 0001       Lumbar Exercises: Aerobic   Recumbent Bike L3x 39mn                       PT Short Term Goals - 12/27/21 1458       PT SHORT TERM GOAL #1   Title Patient will be independent with initial HEP    Status Achieved   12/27/21  Target Date 12/28/21      PT SHORT TERM GOAL #2   Title Patient will verbalize/demonstrate understanding of neutral spine posture and proper body mechanics to reduce strain on lumbar spine    Status Achieved   12/27/21   Target Date 12/28/21               PT Long Term Goals - 02/08/22 1502       PT LONG TERM GOAL #1   Title Patient will demonstrate independent use of ongoing/advanced HEP to facilitate ability to maintain/progress functional gains from skilled physical therapy services    Status Achieved   02/08/22   Target Date 02/08/22      PT LONG TERM GOAL #2   Title Patient to demonstrate ability to achieve and maintain good spinal alignment/posturing and body mechanics needed for daily activities    Status Achieved   02/08/22- pt is now more aware of posture and uses body mechanics tips during daily activites   Target Date 02/08/22      PT LONG TERM GOAL #3   Title Patient to report reduction in frequency and intensity of neck pain and LBP by >/= 50-75% to allow for improved activity tolerance    Status Achieved   02/08/22- pt notes 70% improvement in pain levels   Target Date 02/08/22      PT LONG TERM GOAL #4   Title Patient to  improve cervical and lumbar AROM to WNL without pain provocation    Status Achieved   02/08/22   Target Date 02/08/22      PT LONG TERM GOAL #5   Title Patient will demonstrate improved B proximal UE/LE strength to >/= 4+/5 for improved stability and ease of mobility    Status Achieved   02/08/22- R hip abduction met today, all other muscles met in previous visits   Target Date 02/08/22      PT LONG TERM GOAL #6   Title Patient to report ability to perform ADLs, household, and work-related tasks without limitation due to neck or low back pain, LOM or weakness    Status Achieved   02/08/22- pt reports now she is not having much limitation with ADLs, at first she had trouble with cleaning bathtub and bending down but now has no limitation with these things   Target Date 02/08/22      PT LONG TERM GOAL #7   Title Patient will improve lumbar FOTO to >/= 66 to demonstrate improving function    Status Partially Met   02/08/22- 65%   Target Date 02/08/22                   Plan - 02/08/22 1526     Clinical Impression Statement Pt has made great progress toward goals. She has met goals for strength, ROM, posture/body mechanics, performing ADLs w/o limitations from LBP and overall improvement in pain. FOTO score was 1 point shy of meeting LTG #7. She used to have trouble with cleaning her bathtub and with bending over to pick items up but now she reports no limitation with daily activities. She is also independent with her HEP, denied updates but provided instruction on HEP management. Pt has met all LTGs except LTG #7, she has made great functional progress, will be D/C from PT.    Personal Factors and Comorbidities Comorbidity 3+;Fitness;Past/Current Experience;Time since onset of injury/illness/exacerbation    Comorbidities HLD, IBS, h/o low transverse C-section, insomnia    PT Frequency 1x /  week   1-2x per week   PT Duration 8 weeks    PT Treatment/Interventions ADLs/Self Care Home  Management;Cryotherapy;Electrical Stimulation;Iontophoresis 81m/ml Dexamethasone;Moist Heat;Traction;Ultrasound;Functional mobility training;Therapeutic activities;Therapeutic exercise;Neuromuscular re-education;Patient/family education;Manual techniques;Passive range of motion;Dry needling;Taping;Spinal Manipulations    PT Next Visit Plan D/C    PT Home Exercise Plan Access Code: [email or web address]   Consulted and Agree with Plan of Care Patient             Patient will benefit from skilled therapeutic intervention in order to improve the following deficits and impairments:  Decreased activity tolerance, Decreased mobility, Decreased range of motion, Decreased strength, Hypomobility, Increased fascial restricitons, Increased muscle spasms, Impaired perceived functional ability, Impaired flexibility, Improper body mechanics, Postural dysfunction, Pain  Visit Diagnosis: Chronic bilateral low back pain without sciatica  Cervicalgia  Abnormal posture  Muscle spasm of back  Muscle weakness (generalized)     Problem List Patient Active Problem List   Diagnosis Date Noted   Insomnia 09/23/2019   Hyperlipidemia 03/31/2017   Preventative health care 02/07/2016   Irritable bowel syndrome (IBS)     BArtist Pais PTA 02/08/2022, 3:51 PM  [facility] [address]Phone: [phone]  Fax:  [phone] Name: Mary YEBRAMRN: [id number] of Birth: [birth year]  PHYSICAL THERAPY DISCHARGE SUMMARY  Visits from Start of Care: 7  Current functional level related to goals / functional outcomes:   Refer to above clinical impression and goal assessment.   Remaining deficits:   None.   Education / Equipment:   HEP, pBiomedical scientist  Patient agrees to discharge. Patient goals were met. Patient is being discharged due to being pleased with the current functional  level.  JPercival Spanish PT, MPT 03/01/22, 6:11 PM  [facility] [address]Phone: [phone]  Fax:  [phone]

## 2022-11-20 ENCOUNTER — Encounter: Payer: Self-pay | Admitting: Family Medicine

## 2023-02-27 DIAGNOSIS — F411 Generalized anxiety disorder: Secondary | ICD-10-CM | POA: Diagnosis not present

## 2023-03-06 DIAGNOSIS — F411 Generalized anxiety disorder: Secondary | ICD-10-CM | POA: Diagnosis not present

## 2023-03-13 DIAGNOSIS — F411 Generalized anxiety disorder: Secondary | ICD-10-CM | POA: Diagnosis not present

## 2023-03-20 DIAGNOSIS — F411 Generalized anxiety disorder: Secondary | ICD-10-CM | POA: Diagnosis not present

## 2023-04-03 DIAGNOSIS — F411 Generalized anxiety disorder: Secondary | ICD-10-CM | POA: Diagnosis not present

## 2023-04-10 DIAGNOSIS — F411 Generalized anxiety disorder: Secondary | ICD-10-CM | POA: Diagnosis not present

## 2023-04-17 DIAGNOSIS — F411 Generalized anxiety disorder: Secondary | ICD-10-CM | POA: Diagnosis not present

## 2023-04-20 IMAGING — DX DG CERVICAL SPINE COMPLETE 4+V
5 series · 5 of 5 positions shown · non-contrast
Comparison: None.

CLINICAL DATA: Neck pain.

EXAM:
CERVICAL SPINE - COMPLETE 4+ VIEW

[c-spine lat]
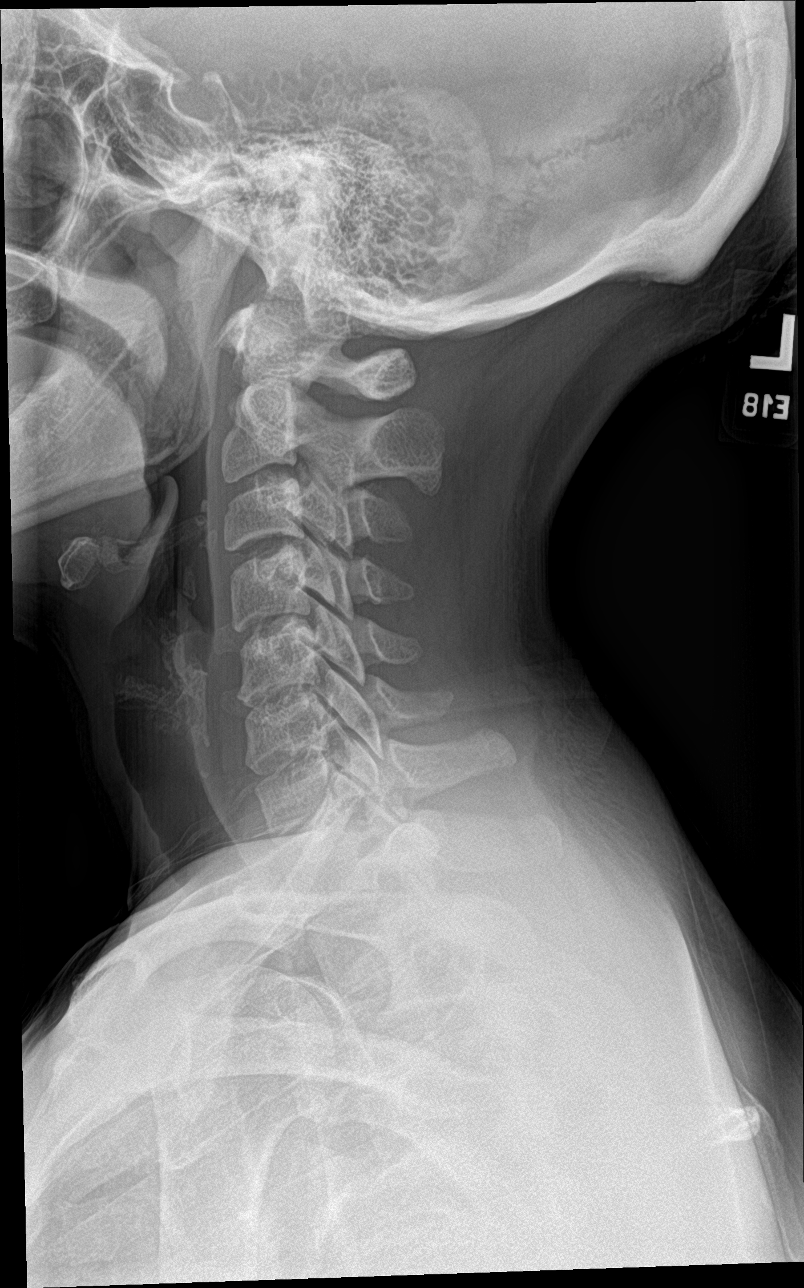

[c-spine obl (1 of 2)]
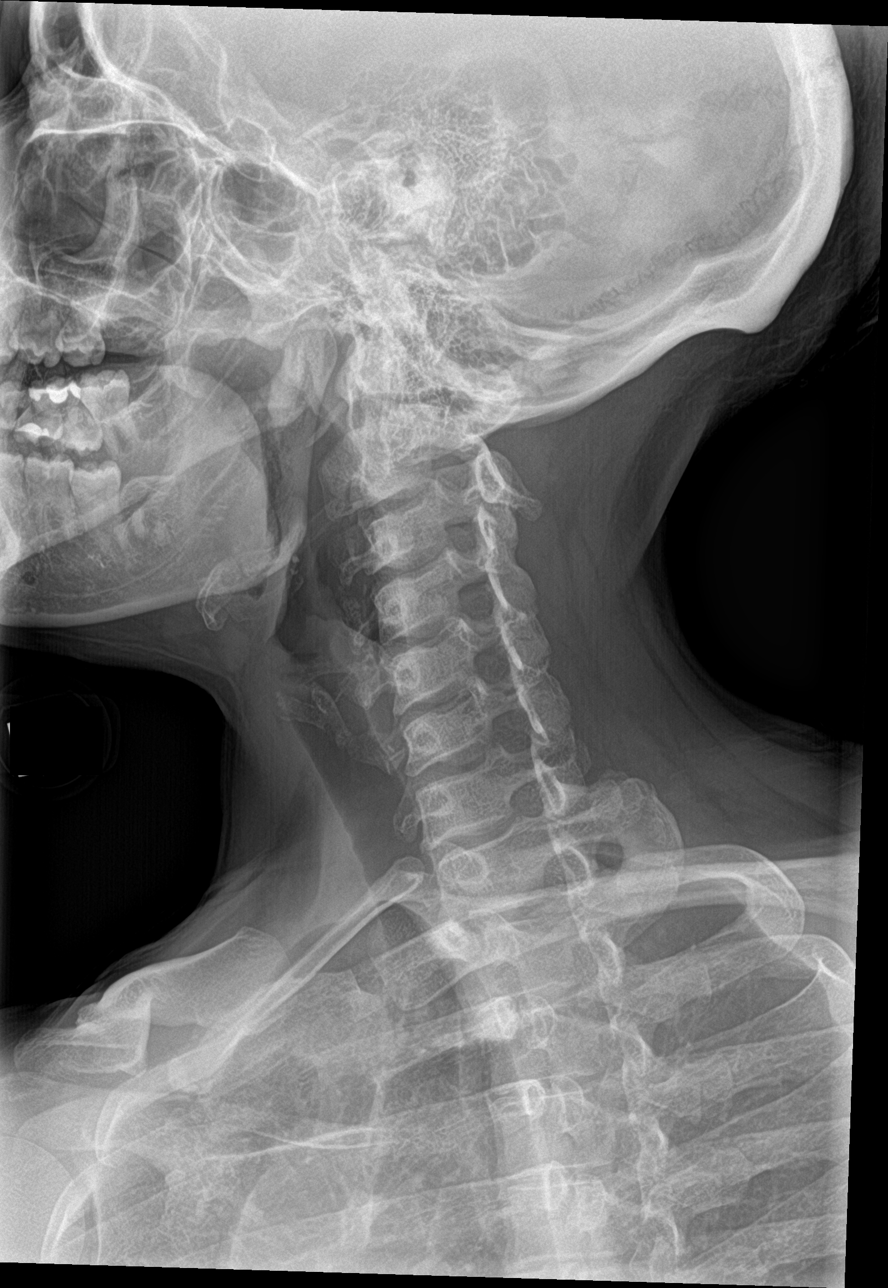

[c-spine obl (2 of 2)]
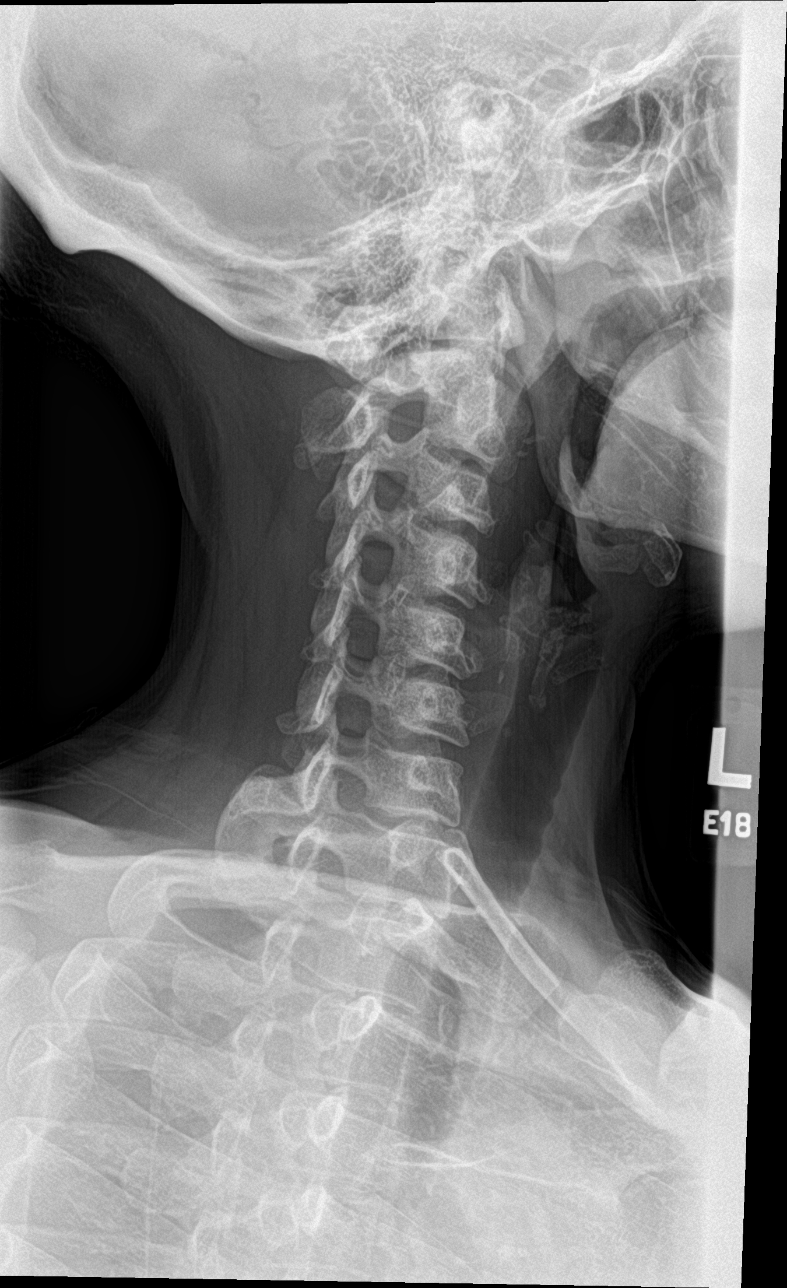

[c-spine ap]
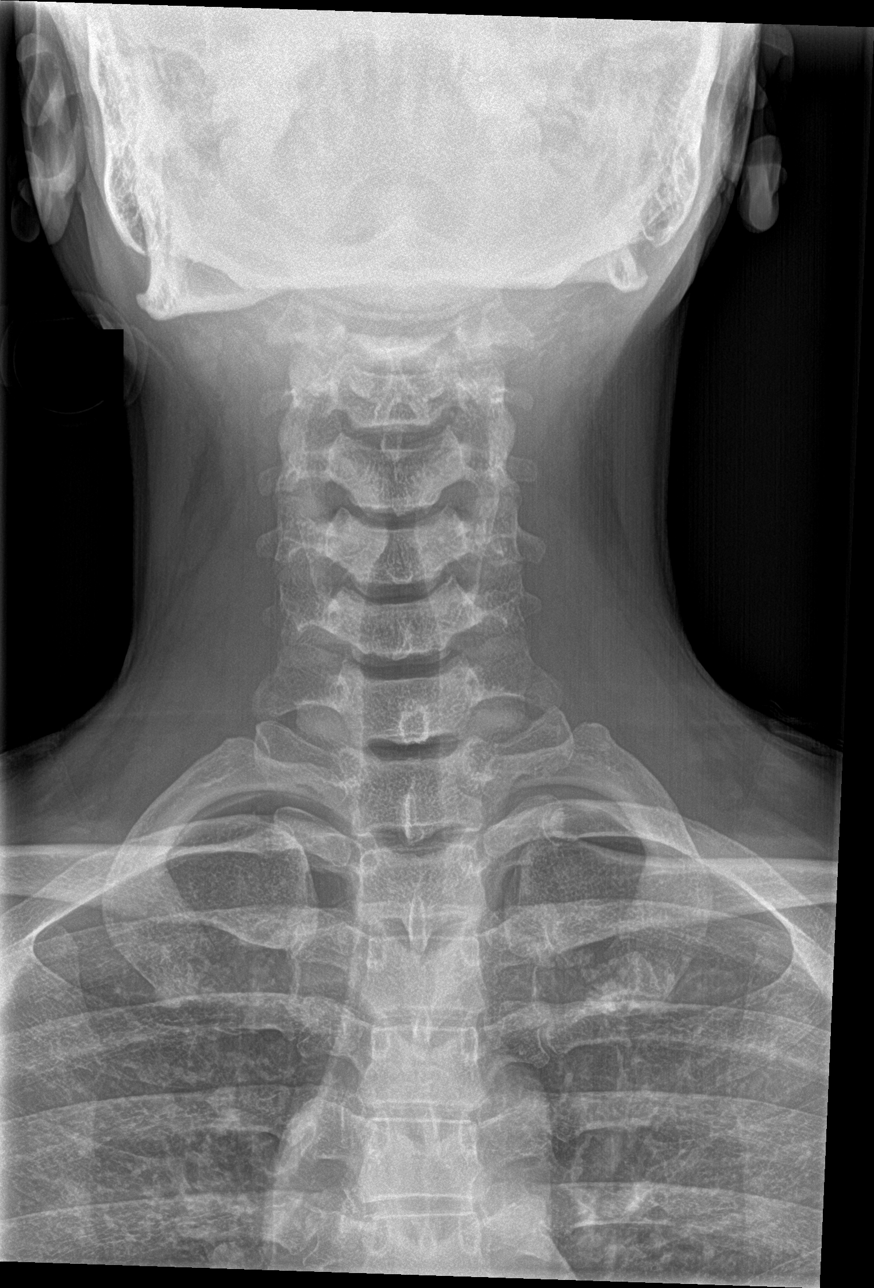

[c-spine open mouth]
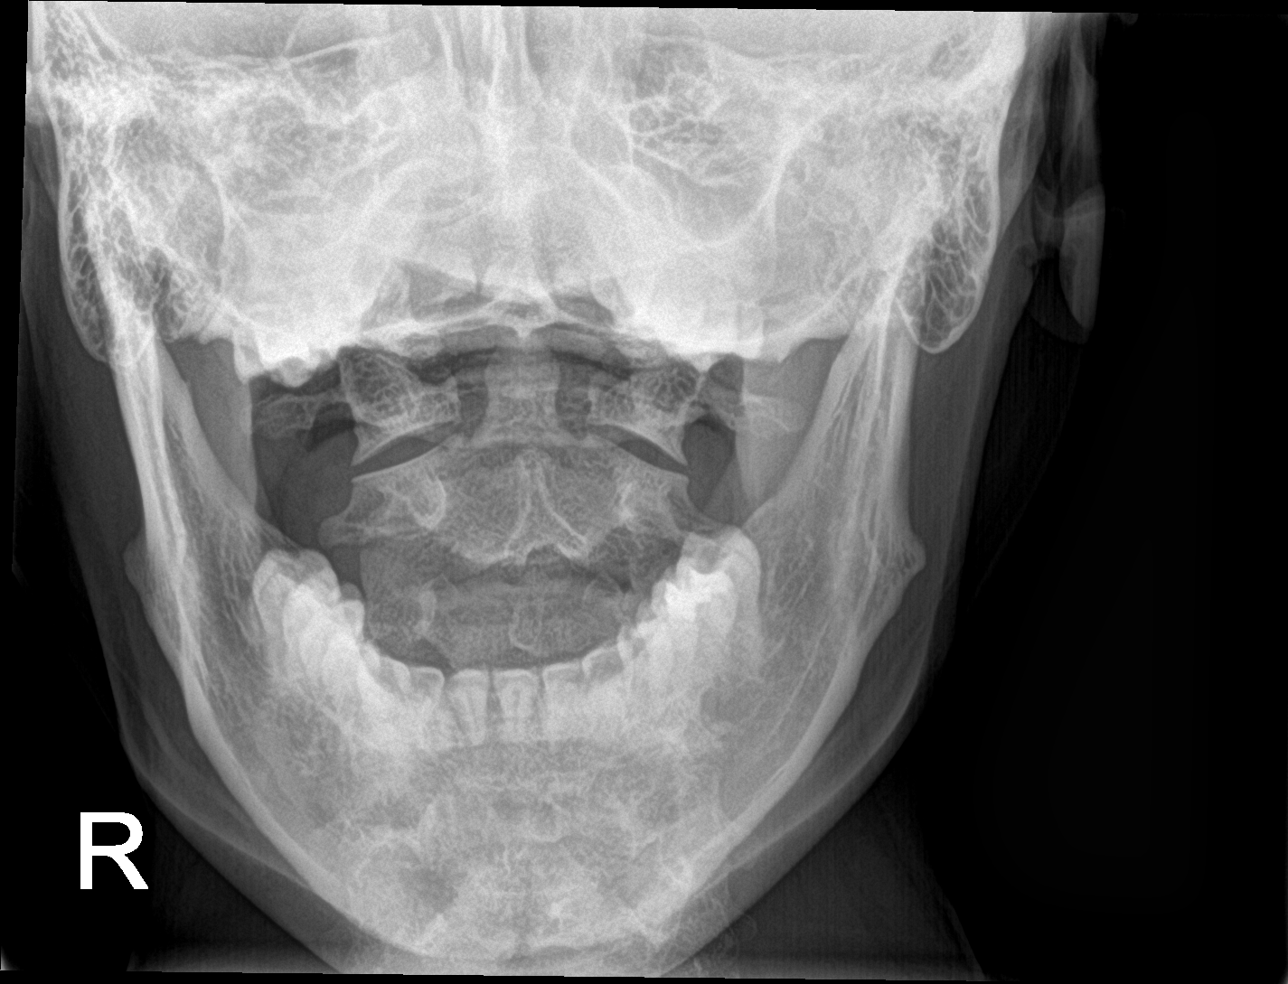

[5 of 5 positions shown; findings below may reference images not displayed]

FINDINGS: There is no evidence of cervical spine fracture or prevertebral soft
tissue swelling. Alignment is normal. No other significant bone
abnormalities are identified.
IMPRESSION: Negative cervical spine radiographs.

## 2023-04-25 DIAGNOSIS — F411 Generalized anxiety disorder: Secondary | ICD-10-CM | POA: Diagnosis not present

## 2023-04-30 ENCOUNTER — Encounter: Payer: 59 | Admitting: Family

## 2023-05-01 DIAGNOSIS — F411 Generalized anxiety disorder: Secondary | ICD-10-CM | POA: Diagnosis not present

## 2023-05-08 DIAGNOSIS — F411 Generalized anxiety disorder: Secondary | ICD-10-CM | POA: Diagnosis not present

## 2023-05-09 ENCOUNTER — Encounter (INDEPENDENT_AMBULATORY_CARE_PROVIDER_SITE_OTHER): Payer: Self-pay

## 2023-05-15 DIAGNOSIS — F411 Generalized anxiety disorder: Secondary | ICD-10-CM | POA: Diagnosis not present

## 2023-05-22 DIAGNOSIS — F411 Generalized anxiety disorder: Secondary | ICD-10-CM | POA: Diagnosis not present

## 2023-05-29 DIAGNOSIS — F411 Generalized anxiety disorder: Secondary | ICD-10-CM | POA: Diagnosis not present

## 2023-06-11 ENCOUNTER — Other Ambulatory Visit (HOSPITAL_BASED_OUTPATIENT_CLINIC_OR_DEPARTMENT_OTHER): Payer: Self-pay

## 2023-06-11 ENCOUNTER — Encounter: Payer: Self-pay | Admitting: Family Medicine

## 2023-06-11 ENCOUNTER — Ambulatory Visit (INDEPENDENT_AMBULATORY_CARE_PROVIDER_SITE_OTHER): Payer: BC Managed Care – PPO | Admitting: Family Medicine

## 2023-06-11 VITALS — BP 118/68 | HR 89 | Temp 98.0°F | Resp 16 | Ht 64.0 in | Wt 210.0 lb

## 2023-06-11 DIAGNOSIS — Z0000 Encounter for general adult medical examination without abnormal findings: Secondary | ICD-10-CM

## 2023-06-11 DIAGNOSIS — G4700 Insomnia, unspecified: Secondary | ICD-10-CM

## 2023-06-11 DIAGNOSIS — Z23 Encounter for immunization: Secondary | ICD-10-CM

## 2023-06-11 DIAGNOSIS — M545 Low back pain, unspecified: Secondary | ICD-10-CM | POA: Diagnosis not present

## 2023-06-11 DIAGNOSIS — Z809 Family history of malignant neoplasm, unspecified: Secondary | ICD-10-CM | POA: Diagnosis not present

## 2023-06-11 DIAGNOSIS — E559 Vitamin D deficiency, unspecified: Secondary | ICD-10-CM | POA: Diagnosis not present

## 2023-06-11 DIAGNOSIS — R002 Palpitations: Secondary | ICD-10-CM | POA: Diagnosis not present

## 2023-06-11 DIAGNOSIS — E785 Hyperlipidemia, unspecified: Secondary | ICD-10-CM

## 2023-06-11 DIAGNOSIS — F419 Anxiety disorder, unspecified: Secondary | ICD-10-CM

## 2023-06-11 MED ORDER — HYDROXYZINE HCL 10 MG PO TABS
10.0000 mg | ORAL_TABLET | Freq: Two times a day (BID) | ORAL | 0 refills | Status: AC | PRN
  Filled 2023-06-11: qty 30, 15d supply, fill #0

## 2023-06-11 MED ORDER — TIZANIDINE HCL 2 MG PO TABS
1.0000 mg | ORAL_TABLET | Freq: Four times a day (QID) | ORAL | 0 refills | Status: AC | PRN
Start: 2023-06-11 — End: ?
  Filled 2023-06-11: qty 30, 4d supply, fill #0

## 2023-06-11 NOTE — Patient Instructions (Signed)
Pendulum probiotics  Fiber daily, benefiber vs psyllium/metamucil  60-80 ounces of fluids  Netflix The Blue Zones  Preventive Care 90-40 Years Old, Female Preventive care refers to lifestyle choices and visits with your health care provider that can promote health and wellness. Preventive care visits are also called wellness exams. What can I expect for my preventive care visit? Counseling During your preventive care visit, your health care provider may ask about your: Medical history, including: Past medical problems. Family medical history. Pregnancy history. Current health, including: Menstrual cycle. Method of birth control. Emotional well-being. Home life and relationship well-being. Sexual activity and sexual health. Lifestyle, including: Alcohol, nicotine or tobacco, and drug use. Access to firearms. Diet, exercise, and sleep habits. Work and work Astronomer. Sunscreen use. Safety issues such as seatbelt and bike helmet use. Physical exam Your health care provider may check your: Height and weight. These may be used to calculate your BMI (body mass index). BMI is a measurement that tells if you are at a healthy weight. Waist circumference. This measures the distance around your waistline. This measurement also tells if you are at a healthy weight and may help predict your risk of certain diseases, such as type 2 diabetes and high blood pressure. Heart rate and blood pressure. Body temperature. Skin for abnormal spots. What immunizations do I need?  Vaccines are usually given at various ages, according to a schedule. Your health care provider will recommend vaccines for you based on your age, medical history, and lifestyle or other factors, such as travel or where you work. What tests do I need? Screening Your health care provider may recommend screening tests for certain conditions. This may include: Pelvic exam and Pap test. Lipid and cholesterol levels. Diabetes  screening. This is done by checking your blood sugar (glucose) after you have not eaten for a while (fasting). Hepatitis B test. Hepatitis C test. HIV (human immunodeficiency virus) test. STI (sexually transmitted infection) testing, if you are at risk. BRCA-related cancer screening. This may be done if you have a family history of breast, ovarian, tubal, or peritoneal cancers. Talk with your health care provider about your test results, treatment options, and if necessary, the need for more tests. Follow these instructions at home: Eating and drinking  Eat a healthy diet that includes fresh fruits and vegetables, whole grains, lean protein, and low-fat dairy products. Take vitamin and mineral supplements as recommended by your health care provider. Do not drink alcohol if: Your health care provider tells you not to drink. You are pregnant, may be pregnant, or are planning to become pregnant. If you drink alcohol: Limit how much you have to 0-1 drink a day. Know how much alcohol is in your drink. In the [place] one drink equals one 12 oz bottle of beer (355 mL), one 5 oz glass of wine (148 mL), or one 1 oz glass of hard liquor (44 mL). Lifestyle Brush your teeth every morning and night with fluoride toothpaste. Floss one time each day. Exercise for at least 30 minutes 5 or more days each week. Do not use any products that contain nicotine or tobacco. These products include cigarettes, chewing tobacco, and vaping devices, such as e-cigarettes. If you need help quitting, ask your health care provider. Do not use drugs. If you are sexually active, practice safe sex. Use a condom or other form of protection to prevent STIs. If you do not wish to become pregnant, use a form of birth control. If you plan to become  pregnant, see your health care provider for a prepregnancy visit. Find healthy ways to manage stress, such as: Meditation, yoga, or listening to music. Journaling. Talking to a trusted  person. Spending time with friends and family. Minimize exposure to UV radiation to reduce your risk of skin cancer. Safety Always wear your seat belt while driving or riding in a vehicle. Do not drive: If you have been drinking alcohol. Do not ride with someone who has been drinking. If you have been using any mind-altering substances or drugs. While texting. When you are tired or distracted. Wear a helmet and other protective equipment during sports activities. If you have firearms in your house, make sure you follow all gun safety procedures. Seek help if you have been physically or sexually abused. What's next? Go to your health care provider once a year for an annual wellness visit. Ask your health care provider how often you should have your eyes and teeth checked. Stay up to date on all vaccines. This information is not intended to replace advice given to you by your health care provider. Make sure you discuss any questions you have with your health care provider. Document Revised: 03/08/2021 Document Reviewed: 03/08/2021 Elsevier Patient Education  2024 [employer].

## 2023-06-12 DIAGNOSIS — M545 Low back pain, unspecified: Secondary | ICD-10-CM | POA: Insufficient documentation

## 2023-06-12 DIAGNOSIS — E559 Vitamin D deficiency, unspecified: Secondary | ICD-10-CM | POA: Insufficient documentation

## 2023-06-12 DIAGNOSIS — R002 Palpitations: Secondary | ICD-10-CM | POA: Insufficient documentation

## 2023-06-12 DIAGNOSIS — Z809 Family history of malignant neoplasm, unspecified: Secondary | ICD-10-CM | POA: Insufficient documentation

## 2023-06-12 DIAGNOSIS — F411 Generalized anxiety disorder: Secondary | ICD-10-CM | POA: Diagnosis not present

## 2023-06-12 DIAGNOSIS — Z23 Encounter for immunization: Secondary | ICD-10-CM | POA: Insufficient documentation

## 2023-06-12 DIAGNOSIS — F419 Anxiety disorder, unspecified: Secondary | ICD-10-CM | POA: Insufficient documentation

## 2023-06-12 NOTE — Assessment & Plan Note (Signed)
Encouraged moist heat and gentle stretching as tolerated. May try NSAIDs and prescription meds as directed and report if symptoms worsen or seek immediate care given rx for Tizanidine to use prn

## 2023-06-12 NOTE — Assessment & Plan Note (Signed)
Tolerating statin, encouraged heart healthy diet, avoid trans fats, minimize simple carbs and saturated fats. Increase exercise as tolerated. Labs ordered and reviewed. Given and reviewed copy of ACP documents from [employer] and encouraged to complete and return requested pap from [facility]m 2024 repeat annually.

## 2023-06-12 NOTE — Assessment & Plan Note (Signed)
-  Referred for genetic testing.

## 2023-06-12 NOTE — Assessment & Plan Note (Signed)
Encouraged good sleep hygiene such as dark, quiet room. No blue/green glowing lights such as computer screens in bedroom. No alcohol or stimulants in evening. Cut down on caffeine as able. Regular exercise is helpful but not just prior to bed time.

## 2023-06-12 NOTE — Assessment & Plan Note (Signed)
Labs reveal deficiency. Start on Vitamin D 18841 IU caps, 1 cap po weekly x 12 weeks. Disp #4 with 4 rf. Also take daily Vitamin D over the counter. If already taking a daily supplement increase by 1000 IU daily and if not start Vitamin D 2000 IU daily.

## 2023-06-12 NOTE — Assessment & Plan Note (Signed)
Encouraged heart healthy diet, increase exercise, avoid trans fats, consider a krill oil cap daily

## 2023-06-12 NOTE — Progress Notes (Signed)
Subjective:    Patient ID: Mary Cunningham, female    DOB: August 28, 1981, 42 y.o.   MRN: [id number]  Chief Complaint  Patient presents with   Annual Exam    Annual Exam    HPI Discussed the use of AI scribe software for clinical note transcription with the patient, who gave verbal consent to proceed.  History of Present Illness   The patient, with a history of IBS, presents with multiple health concerns. The primary concern is back and hip pain, which has been causing discomfort and affecting daily activities. The patient reports that the pain is mostly across the lower back and specifically in the left hip. The pain occasionally flares up, causing discomfort for a couple of days before subsiding. The patient has been managing the pain with physical therapy exercises and is considering chiropractic treatment.  The patient also reports experiencing anxiety and fatigue. They have been attending therapy since June of this year, which has been beneficial in managing these symptoms. The patient has noticed an improvement in their mental health after undergoing therapy and brain spotting. However, they occasionally experience what they believe to be heart palpitations, which they attribute to anxious thoughts.  Additionally, the patient has been experiencing bloating, which they believe could be hormone-related or linked to their history of IBS. They have not been taking any medication for IBS recently but occasionally use Miralax to manage their symptoms. The patient also reports a need to increase their fiber intake and hydration for better gut health.        Past Medical History:  Diagnosis Date   Heart murmur    "as a child"   Hyperlipidemia 03/31/2017   Irritable bowel syndrome (IBS)    Preventative health care 02/07/2016   Status post primary low transverse cesarean section 12/12/2014    Past Surgical History:  Procedure Laterality Date   CESAREAN SECTION N/A 12/10/2014   Procedure:  CESAREAN SECTION;  Surgeon: Hoover Browns, MD;  Location: [facility];  Service: Obstetrics;  Laterality: N/A;   WISDOM TOOTH EXTRACTION      Family History  Problem Relation Age of Onset   Hypertension Mother    Asthma Mother    GER disease Father    Sleep apnea Father    Hypertension Maternal Aunt    Sarcoidosis Maternal Aunt    Lung cancer Maternal Aunt    Stroke Maternal Aunt    Heart failure Maternal Grandmother    Hypertension Maternal Grandmother    Breast cancer Maternal Grandmother    Endometrial cancer Maternal Grandmother    Stroke Maternal Grandmother    Dementia Maternal Grandmother    Cancer Maternal Grandmother    Lung cancer Maternal Grandfather    Dementia Paternal Grandmother    Healthy Paternal Grandfather     Social History   Socioeconomic History   Marital status: Single    Spouse name: Not on file   Number of children: Not on file   Years of education: Not on file   Highest education level: Not on file  Occupational History   Occupation: RN   Tobacco Use   Smoking status: Never   Smokeless tobacco: Never  Substance and Sexual Activity   Alcohol use: No   Drug use: No   Sexual activity: Yes    Birth control/protection: I.U.D.  Other Topics Concern   Not on file  Social History Narrative   Lives with husband and son   No major dietary restrictions   Works  at [employer]   Exercise 3-4 times a week   Social Determinants of Health   Financial Resource Strain: Not on File (03/16/2022)   Received from [employer], [employer]    Financial Resource Strain: 0  Food Insecurity: Not on file (06/04/2023)  Transportation Needs: Not on File (03/16/2022)   Received from [facility], [employer] Needs    Transportation: 0  Physical Activity: Not on File (03/16/2022)   Received from [place], [place]   Physical Activity    Physical Activity: 0  Stress: Not on File (03/16/2022)   Received from [facility], [place]   Stress    Stress: 0  Social  Connections: Not on File (06/08/2023)   Received from [facility]   Social Connections    Connectedness: 0  Intimate Partner Violence: Unknown (12/29/2021)   Received from [facility], [facility]   HITS    Physically Hurt: Not on file    Insult or Talk Down To: Not on file    Threaten Physical Harm: Not on file    Scream or Curse: Not on file    Outpatient Medications Prior to Visit  Medication Sig Dispense Refill   cyclobenzaprine (FLEXERIL) 10 MG tablet Take 0.5-1 tablets (5-10 mg total) by mouth 3 (three) times daily as needed for muscle spasms. 30 tablet 0   Multiple Vitamin (MULTIVITAMIN) tablet Take 1 tablet by mouth daily.     naproxen sodium (ANAPROX DS) 550 MG tablet Take 1 tablet (550 mg total) by mouth 2 (two) times daily with a meal. 30 tablet 0   No facility-administered medications prior to visit.    No Known Allergies  Review of Systems  Constitutional:  Negative for chills, fever and malaise/fatigue.  HENT:  Negative for congestion and hearing loss.   Eyes:  Negative for discharge.  Respiratory:  Negative for cough, sputum production and shortness of breath.   Cardiovascular:  Negative for chest pain, palpitations and leg swelling.  Gastrointestinal:  Negative for abdominal pain, blood in stool, constipation, diarrhea, heartburn, nausea and vomiting.  Genitourinary:  Negative for dysuria, frequency, hematuria and urgency.  Musculoskeletal:  Positive for back pain. Negative for falls and myalgias.  Skin:  Negative for rash.  Neurological:  Negative for dizziness, sensory change, loss of consciousness, weakness and headaches.  Endo/Heme/Allergies:  Negative for environmental allergies. Does not bruise/bleed easily.  Psychiatric/Behavioral:  Negative for depression and suicidal ideas. The patient is nervous/anxious and has insomnia.        Objective:    Physical Exam Constitutional:      General: She is not in acute distress.    Appearance: Normal appearance. She  is not diaphoretic.  HENT:     Head: Normocephalic and atraumatic.     Right Ear: Tympanic membrane, ear canal and external ear normal.     Left Ear: Tympanic membrane, ear canal and external ear normal.     Nose: Nose normal.     Mouth/Throat:     Mouth: Mucous membranes are moist.     Pharynx: Oropharynx is clear. No oropharyngeal exudate.  Eyes:     General: No scleral icterus.       Right eye: No discharge.        Left eye: No discharge.     Conjunctiva/sclera: Conjunctivae normal.     Pupils: Pupils are equal, round, and reactive to light.  Neck:     Thyroid: No thyromegaly.  Cardiovascular:     Rate and Rhythm: Normal  rate and regular rhythm.     Heart sounds: Normal heart sounds. No murmur heard. Pulmonary:     Effort: Pulmonary effort is normal. No respiratory distress.     Breath sounds: Normal breath sounds. No wheezing or rales.  Abdominal:     General: Bowel sounds are normal. There is no distension.     Palpations: Abdomen is soft. There is no mass.     Tenderness: There is no abdominal tenderness.  Musculoskeletal:        General: No tenderness. Normal range of motion.     Cervical back: Normal range of motion and neck supple.  Lymphadenopathy:     Cervical: No cervical adenopathy.  Skin:    General: Skin is warm and dry.     Findings: No rash.  Neurological:     General: No focal deficit present.     Mental Status: She is alert and oriented to person, place, and time.     Cranial Nerves: No cranial nerve deficit.     Coordination: Coordination normal.     Deep Tendon Reflexes: Reflexes are normal and symmetric. Reflexes normal.  Psychiatric:        Mood and Affect: Mood normal.        Behavior: Behavior normal.        Thought Content: Thought content normal.        Judgment: Judgment normal.     BP 118/68 (BP Location: Left Arm, Patient Position: Sitting, Cuff Size: Normal)   Pulse 89   Temp 98 F (36.7 C) (Oral)   Resp 16   Ht 5\' 4"  (1.626 m)    Wt 210 lb (95.3 kg)   SpO2 98%   BMI 36.05 kg/m  Wt Readings from Last 3 Encounters:  06/11/23 210 lb (95.3 kg)  10/23/21 196 lb (88.9 kg)  09/22/19 192 lb (87.1 kg)    Diabetic Foot Exam - Simple   No data filed    Lab Results  Component Value Date   WBC 7.2 06/11/2023   HGB 13.8 06/11/2023   HCT 42.6 06/11/2023   PLT 323.0 06/11/2023   GLUCOSE 58 (L) 06/11/2023   CHOL 186 06/11/2023   TRIG 89.0 06/11/2023   HDL 50.60 06/11/2023   LDLCALC 117 (H) 06/11/2023   ALT 13 06/11/2023   AST 15 06/11/2023   NA 139 06/11/2023   K 4.3 06/11/2023   CL 104 06/11/2023   CREATININE 1.21 (H) 06/11/2023   BUN 14 06/11/2023   CO2 29 06/11/2023   TSH 1.56 06/11/2023    Lab Results  Component Value Date   TSH 1.56 06/11/2023   Lab Results  Component Value Date   WBC 7.2 06/11/2023   HGB 13.8 06/11/2023   HCT 42.6 06/11/2023   MCV 90.2 06/11/2023   PLT 323.0 06/11/2023   Lab Results  Component Value Date   NA 139 06/11/2023   K 4.3 06/11/2023   CO2 29 06/11/2023   GLUCOSE 58 (L) 06/11/2023   BUN 14 06/11/2023   CREATININE 1.21 (H) 06/11/2023   BILITOT 0.3 06/11/2023   ALKPHOS 74 06/11/2023   AST 15 06/11/2023   ALT 13 06/11/2023   PROT 6.9 06/11/2023   ALBUMIN 4.2 06/11/2023   CALCIUM 9.8 06/11/2023   GFR 56.22 (L) 06/11/2023   Lab Results  Component Value Date   CHOL 186 06/11/2023   Lab Results  Component Value Date   HDL 50.60 06/11/2023   Lab Results  Component Value Date  LDLCALC 117 (H) 06/11/2023   Lab Results  Component Value Date   TRIG 89.0 06/11/2023   Lab Results  Component Value Date   CHOLHDL 4 06/11/2023   No results found for: "HGBA1C"     Assessment & Plan:  Low back pain, unspecified back pain laterality, unspecified chronicity, unspecified whether sciatica present Assessment & Plan: Encouraged moist heat and gentle stretching as tolerated. May try NSAIDs and prescription meds as directed and report if symptoms worsen or seek  immediate care given rx for Tizanidine to use prn  Orders: -     Ambulatory referral to Sports Medicine  FH: cancer Assessment & Plan: Referred for genetic testing  Orders: -     Ambulatory referral to Genetics  Vitamin D deficiency Assessment & Plan: Labs reveal deficiency. Start on Vitamin D 82956 IU caps, 1 cap po weekly x 12 weeks. Disp #4 with 4 rf. Also take daily Vitamin D over the counter. If already taking a daily supplement increase by 1000 IU daily and if not start Vitamin D 2000 IU daily.    Orders: -     Comprehensive metabolic panel -     Lipid panel -     VITAMIN D 25 Hydroxy (Vit-D Deficiency, Fractures)  Hyperlipidemia, unspecified hyperlipidemia type Assessment & Plan: Encouraged heart healthy diet, increase exercise, avoid trans fats, consider a krill oil cap daily  Orders: -     Comprehensive metabolic panel -     Lipid panel  Palpitations Assessment & Plan: Short lived and no associated symptoms. Report worsening symptoms  Orders: -     CBC with Differential/Platelet -     Comprehensive metabolic panel -     TSH  Need for influenza vaccination -     Flu vaccine trivalent PF, 6mos and older(Flulaval,Afluria,Fluarix,Fluzone)  Preventative health care Assessment & Plan: Tolerating statin, encouraged heart healthy diet, avoid trans fats, minimize simple carbs and saturated fats. Increase exercise as tolerated. Labs ordered and reviewed. Given and reviewed copy of ACP documents from [employer] and encouraged to complete and return requested pap from [facility]. Mgm 2024 repeat annually.    Insomnia, unspecified type Assessment & Plan: Encouraged good sleep hygiene such as dark, quiet room. No blue/green glowing lights such as computer screens in bedroom. No alcohol or stimulants in evening. Cut down on caffeine as able. Regular exercise is helpful but not just prior to bed time.     Other orders -     tiZANidine HCl; Take 0.5-2  tablets (1-4 mg total) by mouth every 6 (six) hours as needed for muscle spasms.  Dispense: 30 tablet; Refill: 0 -     hydrOXYzine HCl; Take 1 tablet (10 mg total) by mouth 2 (two) times daily as needed.  Dispense: 30 tablet; Refill: 0    Assessment and Plan    Lower Back and Hip Pain Chronic pain in the lower back and left hip. Pain is intermittent and flares up occasionally. Patient has been doing physical therapy exercises which provide some relief. -Continue physical therapy exercises as needed. -Start Tizanidine 2-4mg  at bedtime for flare-ups or if overexertion is anticipated. -Referral to sports medicine for further evaluation and management.  Anxiety Patient reports occasional anxiety and palpitations, possibly stress-related. No other associated symptoms reported. Patient is not currently on any medication for anxiety and prefers non-pharmacological management. -Consider Buspar as needed for anxiety attacks. -prescribed Hydroxyzine 10 mg as needed for anxiety attacks. -Continue non-pharmacological management strategies including  counseling and exercise.  Irritable Bowel Syndrome (IBS) Patient reports history of IBS with constipation. Currently not on any medication for IBS. Reports occasional use of Miralax. -Consider daily fiber supplement (Benefiber or Psyllium). -Continue Miralax as needed. -Consider daily probiotic (Pendulum).  General Health Maintenance -Continue multivitamin with minerals and fatty acid supplement. -Consider daily Vitamin D supplement. -Order blood work including cholesterol, metabolic panel, CBC, Vitamin D, thyroid function tests. -Flu shot to be administered. -Follow-up with OB/GYN for Pap smear. -Plan for colonoscopy at age 11 unless changes in bowel habits occur. -Stay hydrated with 60-80 ounces of fluids daily.         Danise Edge, MD

## 2023-06-12 NOTE — Assessment & Plan Note (Signed)
She has started therapy and finds that helpful. For now given Hydroxyzine 10 mg to try prn

## 2023-06-12 NOTE — Assessment & Plan Note (Signed)
Short lived and no associated symptoms. Report worsening symptoms

## 2023-06-14 ENCOUNTER — Other Ambulatory Visit: Payer: Self-pay

## 2023-06-14 ENCOUNTER — Other Ambulatory Visit (HOSPITAL_BASED_OUTPATIENT_CLINIC_OR_DEPARTMENT_OTHER): Payer: Self-pay

## 2023-06-14 MED ORDER — VITAMIN D (ERGOCALCIFEROL) 1.25 MG (50000 UNIT) PO CAPS
50000.0000 [IU] | ORAL_CAPSULE | ORAL | 4 refills | Status: AC
  Filled 2023-06-14: qty 5, 35d supply, fill #0
  Filled 2023-07-14: qty 5, 35d supply, fill #1

## 2023-06-24 ENCOUNTER — Telehealth: Payer: Self-pay | Admitting: Genetic Counselor

## 2023-06-25 NOTE — Telephone Encounter (Signed)
06/24/23 per IB message: I called patient and scheduled genetic counseling and lab appointment. Patient is aware of date and time

## 2023-06-26 DIAGNOSIS — F411 Generalized anxiety disorder: Secondary | ICD-10-CM | POA: Diagnosis not present

## 2023-07-03 DIAGNOSIS — F411 Generalized anxiety disorder: Secondary | ICD-10-CM | POA: Diagnosis not present

## 2023-07-10 DIAGNOSIS — Z01419 Encounter for gynecological examination (general) (routine) without abnormal findings: Secondary | ICD-10-CM | POA: Diagnosis not present

## 2023-07-10 DIAGNOSIS — Z113 Encounter for screening for infections with a predominantly sexual mode of transmission: Secondary | ICD-10-CM | POA: Diagnosis not present

## 2023-07-10 DIAGNOSIS — Z1231 Encounter for screening mammogram for malignant neoplasm of breast: Secondary | ICD-10-CM | POA: Diagnosis not present

## 2023-07-10 DIAGNOSIS — F411 Generalized anxiety disorder: Secondary | ICD-10-CM | POA: Diagnosis not present

## 2023-07-15 ENCOUNTER — Other Ambulatory Visit (HOSPITAL_BASED_OUTPATIENT_CLINIC_OR_DEPARTMENT_OTHER): Payer: Self-pay

## 2023-07-17 DIAGNOSIS — F411 Generalized anxiety disorder: Secondary | ICD-10-CM | POA: Diagnosis not present

## 2023-07-29 ENCOUNTER — Inpatient Hospital Stay: Payer: BC Managed Care – PPO | Attending: Nurse Practitioner | Admitting: Genetic Counselor

## 2023-07-29 ENCOUNTER — Inpatient Hospital Stay: Payer: BC Managed Care – PPO

## 2023-07-29 ENCOUNTER — Other Ambulatory Visit: Payer: Self-pay | Admitting: Genetic Counselor

## 2023-07-29 DIAGNOSIS — Z8049 Family history of malignant neoplasm of other genital organs: Secondary | ICD-10-CM

## 2023-07-29 DIAGNOSIS — Z8042 Family history of malignant neoplasm of prostate: Secondary | ICD-10-CM | POA: Diagnosis not present

## 2023-07-29 DIAGNOSIS — Z1379 Encounter for other screening for genetic and chromosomal anomalies: Secondary | ICD-10-CM

## 2023-07-29 DIAGNOSIS — Z803 Family history of malignant neoplasm of breast: Secondary | ICD-10-CM

## 2023-07-29 DIAGNOSIS — Z801 Family history of malignant neoplasm of trachea, bronchus and lung: Secondary | ICD-10-CM | POA: Diagnosis not present

## 2023-07-29 LAB — GENETIC SCREENING ORDER

## 2023-07-31 ENCOUNTER — Encounter: Payer: Self-pay | Admitting: Genetic Counselor

## 2023-07-31 NOTE — Progress Notes (Signed)
REFERRING PROVIDER: Bradd Canary, MD [address] [place],  [place] [address]  PRIMARY PROVIDER:  Bradd Canary, MD  PRIMARY REASON FOR VISIT:  1. Family history of breast cancer   2. Family history of prostate cancer   3. Family history of uterine cancer    HISTORY OF PRESENT ILLNESS:   Ms. Rae, a 42 y.o. female, was seen for a [facility] cancer genetics consultation at the request of Dr. Abner Greenspan due to a family history of cancer.  Ms. Kamaka presents to clinic today to discuss the possibility of a hereditary predisposition to cancer, to discuss genetic testing, and to further clarify her future cancer risks, as well as potential cancer risks for family members.   Ms. Benett is a 42 y.o. female with no personal history of cancer.    RISK FACTORS:  Menarche was at age 29.  First live birth at age 30.  OCP use for approximately  >10  years.  Ovaries intact: yes.  Uterus intact: yes.  Menopausal status: premenopausal.  HRT use: 0 years. Colonoscopy: yes; normal. Mammogram within the last year: yes. Number of breast biopsies: 0. Any excessive radiation exposure in the past: no  Past Medical History:  Diagnosis Date   Heart murmur    "as a child"   Hyperlipidemia 03/31/2017   Irritable bowel syndrome (IBS)    Preventative health care 02/07/2016   Status post primary low transverse cesarean section 12/12/2014    Past Surgical History:  Procedure Laterality Date   CESAREAN SECTION N/A 12/10/2014   Procedure: CESAREAN SECTION;  Surgeon: Hoover Browns, MD;  Location: [facility];  Service: Obstetrics;  Laterality: N/A;   WISDOM TOOTH EXTRACTION      Social History   Socioeconomic History   Marital status: Single    Spouse name: Not on file   Number of children: Not on file   Years of education: Not on file   Highest education level: Not on file  Occupational History   Occupation: RN   Tobacco Use   Smoking status: Never   Smokeless tobacco: Never   Substance and Sexual Activity   Alcohol use: No   Drug use: No   Sexual activity: Yes    Birth control/protection: I.U.D.  Other Topics Concern   Not on file  Social History Narrative   Lives with husband and son   No major dietary restrictions   Works at [employer]   Exercise 3-4 times a week   Social Determinants of Corporate investment banker Strain: Not on File (03/16/2022)   Received from [employer], [employer]    Financial Resource Strain: 0  Food Insecurity: Not on File (06/20/2023)   Received from [employer]    Food: 0  Transportation Needs: Not on File (03/16/2022)   Received from [employer], [employer] Needs    Transportation: 0  Physical Activity: Not on File (03/16/2022)   Received from [place], [place]   Physical Activity    Physical Activity: 0  Stress: Not on File (03/16/2022)   Received from [facility], [place]   Stress    Stress: 0  Social Connections: Not on File (06/08/2023)   Received from [employer]    Connectedness: 0     FAMILY HISTORY:  We obtained a detailed, 4-generation family history.  Significant diagnoses are listed below: Family History  Problem Relation Age of Onset   Hypertension Mother  Asthma Mother    GER disease Father    Sleep apnea Father    Prostate cancer Father        dx. >50   Hypertension Maternal Aunt    Sarcoidosis Maternal Aunt    Lung cancer Maternal Aunt        smoked   Stroke Maternal Aunt    Lung cancer Maternal Uncle        smoked   Heart failure Maternal Grandmother    Hypertension Maternal Grandmother    Breast cancer Maternal Grandmother 86 - 74   Endometrial cancer Maternal Grandmother 93 - 79   Stroke Maternal Grandmother    Dementia Maternal Grandmother    Lung cancer Maternal Grandfather        smoked   Dementia Paternal Grandmother    Healthy Paternal Grandfather      Ms. Bashore is unaware of previous family history of genetic testing for hereditary  cancer risks. There is no reported Ashkenazi Jewish ancestry.   GENETIC COUNSELING ASSESSMENT: Ms. Treu is a 42 y.o. female with a family history of cancer which is somewhat suggestive of a hereditary predisposition to cancer. We, therefore, discussed and recommended the following at today's visit.   DISCUSSION: We discussed that 5 - 10% of cancer is hereditary, with most cases of hereditary breast cancer associated with BRCA1/2.  There are other genes that can be associated with hereditary breast cancer syndromes.  We discussed that testing is beneficial for several reasons, including knowing about other cancer risks, identifying potential screening and risk-reduction options that may be appropriate, and to understanding if other family members could be at risk for cancer and allowing them to undergo genetic testing.  We reviewed the characteristics, features and inheritance patterns of hereditary cancer syndromes. We also discussed genetic testing, including the appropriate family members to test, the process of testing, insurance coverage and turn-around-time for results. We discussed the implications of a negative, positive, carrier and/or variant of uncertain significant result. We discussed that negative results would be uninformative given that Ms. Vanyo does not have a personal history of cancer.   We discussed with Ms. Cavendish that the family history does not meet insurance or NCCN criteria for genetic testing and, therefore, is not highly consistent with a familial hereditary cancer syndrome.  We feel she is at low risk to harbor a gene mutation associated with such a condition. Thus, we did not recommend any genetic testing, at this time. However, Ms. Goings still wanted to pursue genetic testing for the benefit of herself and her son.  Ms. Chesnut was offered a common hereditary cancer panel (34 genes) and an expanded pan-cancer panel (71 genes). Ms. Hovanec was informed of the  benefits and limitations of each panel, including that expanded pan-cancer panels contain several genes that do not have clear management guidelines at this point in time.  We also discussed that as the number of genes included on a panel increases, the chances of variants of uncertain significance increases.  After considering the benefits and limitations of each gene panel, Ms. Silbernagel elected to have [facility] CancerNext-Expanded Panel.  The CancerNext-Expanded gene panel offered by [facility] and includes sequencing, rearrangement, and RNA analysis for the following 71 genes: AIP, ALK, APC, ATM, AXIN2, BAP1, BARD1, BMPR1A, BRCA1, BRCA2, BRIP1, CDC73, CDH1, CDK4, CDKN1B, CDKN2A, CHEK2, CTNNA1, DICER1, FH, FLCN, KIF1B, LZTR1, MAX, MEN1, MET, MLH1, MSH2, MSH3, MSH6, MUTYH, NF1, NF2, NTHL1, PALB2, PHOX2B, PMS2, POT1, PRKAR1A, PTCH1, PTEN, RAD51C, RAD51D, RB1,  RET, SDHA, SDHAF2, SDHB, SDHC, SDHD, SMAD4, SMARCA4, SMARCB1, SMARCE1, STK11, SUFU, TMEM127, TP53, TSC1, TSC2, and VHL (sequencing and deletion/duplication); EGFR, EGLN1, HOXB13, KIT, MITF, PDGFRA, POLD1, and POLE (sequencing only); EPCAM and GREM1 (deletion/duplication only).    We discussed that some people do not want to undergo genetic testing due to fear of genetic discrimination.  A federal law called the Genetic Information Non-Discrimination Act (GINA) of 2008 helps protect individuals against genetic discrimination based on their genetic test results.  It impacts both health insurance and employment.  With health insurance, it protects against increased premiums, being kicked off insurance or being forced to take a test in order to be insured.  For employment it protects against hiring, firing and promoting decisions based on genetic test results.  GINA does not apply to those in the [employer], those who work for companies with less than 15 employees, and new life insurance or long-term disability insurance policies.  Health status due to a cancer  diagnosis is not protected under GINA.  PLAN: After considering the risks, benefits, and limitations, Ms. Fogarty provided informed consent to pursue genetic testing and the blood sample was sent to [facility] for analysis of the CancerNext-Expanded Panel. Results should be available within approximately 2-3 weeks' time, at which point they will be disclosed by telephone to Ms. Sawhney, as will any additional recommendations warranted by these results. Ms. Baptista will receive a summary of her genetic counseling visit and a copy of her results once available. This information will also be available in Epic.   Ms. Duell questions were answered to her satisfaction today. Our contact information was provided should additional questions or concerns arise. Thank you for the referral and allowing [place] to share in the care of your patient.   Lalla Brothers, MS, [facility] Genetic Counselor [email or web address] (P) [phone]  The patient was seen for a total of 40 minutes in face-to-face genetic counseling. The patient was seen alone.  Drs. Pamelia Hoit and/or Mosetta Putt were available to discuss this case as needed.  _______________________________________________________________________ For Office Staff:  Number of people involved in session: 1 Was an Intern/ student involved with case: no

## 2023-08-07 DIAGNOSIS — F411 Generalized anxiety disorder: Secondary | ICD-10-CM | POA: Diagnosis not present

## 2023-08-14 DIAGNOSIS — F411 Generalized anxiety disorder: Secondary | ICD-10-CM | POA: Diagnosis not present

## 2023-08-16 ENCOUNTER — Telehealth: Payer: Self-pay | Admitting: Genetic Counselor

## 2023-08-16 ENCOUNTER — Encounter: Payer: Self-pay | Admitting: Genetic Counselor

## 2023-08-16 DIAGNOSIS — Z1379 Encounter for other screening for genetic and chromosomal anomalies: Secondary | ICD-10-CM | POA: Insufficient documentation

## 2023-08-16 NOTE — Telephone Encounter (Addendum)
I contacted Ms. Hartkopf to discuss her genetic testing results. No pathogenic variants were identified in the 71 genes analyzed. Detailed clinic note to follow.  The test report has been scanned into EPIC and is located under the Molecular Pathology section of the Results Review tab.  A portion of the result report is included below for reference.   Lalla Brothers, MS, [facility] Genetic Counselor [email or web address] (P) [phone]

## 2023-08-23 ENCOUNTER — Encounter: Payer: Self-pay | Admitting: Genetic Counselor

## 2023-08-23 ENCOUNTER — Ambulatory Visit: Payer: Self-pay | Admitting: Genetic Counselor

## 2023-08-23 DIAGNOSIS — Z1379 Encounter for other screening for genetic and chromosomal anomalies: Secondary | ICD-10-CM

## 2023-08-23 NOTE — Progress Notes (Signed)
HPI:   Mary Cunningham was previously seen in the [facility] due to a family history of cancer and concerns regarding a hereditary predisposition to cancer. Please refer to our prior cancer genetics clinic note for more information regarding our discussion, assessment and recommendations, at the time. Mary Cunningham recent genetic test results were disclosed to her, as were recommendations warranted by these results. These results and recommendations are discussed in more detail below.  CANCER HISTORY:  Oncology History   No history exists.    FAMILY HISTORY:  We obtained a detailed, 4-generation family history.  Significant diagnoses are listed below:      Family History  Problem Relation Age of Onset   Hypertension Mother     Asthma Mother     GER disease Father     Sleep apnea Father     Prostate cancer Father          dx. >50   Hypertension Maternal Aunt     Sarcoidosis Maternal Aunt     Lung cancer Maternal Aunt          smoked   Stroke Maternal Aunt     Lung cancer Maternal Uncle          smoked   Heart failure Maternal Grandmother     Hypertension Maternal Grandmother     Breast cancer Maternal Grandmother 70 - 74   Endometrial cancer Maternal Grandmother 21 - 79   Stroke Maternal Grandmother     Dementia Maternal Grandmother     Lung cancer Maternal Grandfather          smoked   Dementia Paternal Grandmother     Healthy Paternal Grandfather             Mary Cunningham is unaware of previous family history of genetic testing for hereditary cancer risks. There is no reported Ashkenazi Jewish ancestry.   GENETIC TEST RESULTS:  The [facility] CancerNext-Expanded Panel found no pathogenic mutations.   The CancerNext-Expanded gene panel offered by [facility] and includes sequencing, rearrangement, and RNA analysis for the following 71 genes: AIP, ALK, APC, ATM, AXIN2, BAP1, BARD1, BMPR1A, BRCA1, BRCA2, BRIP1, CDC73, CDH1, CDK4, CDKN1B, CDKN2A, CHEK2,  CTNNA1, DICER1, FH, FLCN, KIF1B, LZTR1, MAX, MEN1, MET, MLH1, MSH2, MSH3, MSH6, MUTYH, NF1, NF2, NTHL1, PALB2, PHOX2B, PMS2, POT1, PRKAR1A, PTCH1, PTEN, RAD51C, RAD51D, RB1, RET, SDHA, SDHAF2, SDHB, SDHC, SDHD, SMAD4, SMARCA4, SMARCB1, SMARCE1, STK11, SUFU, TMEM127, TP53, TSC1, TSC2, and VHL (sequencing and deletion/duplication); EGFR, EGLN1, HOXB13, KIT, MITF, PDGFRA, POLD1, and POLE (sequencing only); EPCAM and GREM1 (deletion/duplication only).   The test report has been scanned into EPIC and is located under the Molecular Pathology section of the Results Review tab.  A portion of the result report is included below for reference. Genetic testing reported out on 08/13/2023.       Even though a pathogenic variant was not identified, possible explanations for the cancer in the family may include: There may be no hereditary risk for cancer in the family. The cancers in her family may be due to other genetic or environmental factors. There may be a gene mutation in one of these genes that current testing methods cannot detect, but that chance is small. There could be another gene that has not yet been discovered, or that we have not yet tested, that is responsible for the cancer diagnoses in the family.  It is also possible there is a hereditary cause for the cancer in the family that  [name] did not inherit.  Therefore, it is important to remain in touch with cancer genetics in the future so that we can continue to offer Mary Cunningham the most up to date genetic testing.   ADDITIONAL GENETIC TESTING:  We discussed with Mary Cunningham that her genetic testing was fairly extensive.  If there are genes identified to increase cancer risk that can be analyzed in the future, we would be happy to discuss and coordinate this testing at that time.    CANCER SCREENING RECOMMENDATIONS:  Mary Cunningham test result is considered negative (normal).  This means that we have not identified a hereditary cause  for her family history of cancer at this time.   An individual's cancer risk and medical management are not determined by genetic test results alone. Overall cancer risk assessment incorporates additional factors, including personal medical history, family history, and any available genetic information that may result in a personalized plan for cancer prevention and surveillance. Therefore, it is recommended she continue to follow the cancer management and screening guidelines provided by her primary healthcare provider.  Based on the reported personal and family history, specific cancer screenings for [name] and her family include:  Breast Cancer Screening:  The Tyrer-Cuzick model is one of multiple prediction models developed to estimate an individual's lifetime risk of developing breast cancer. The Tyrer-Cuzick model is endorsed by the [employer] ([employer]CC[employer]). This model includes many risk factors such as family history, endogenous estrogen exposure, and benign breast disease. The calculation is highly-dependent on the accuracy of clinical data provided by the patient and can change over time. The Tyrer-Cuzick model may be repeated to reflect new information in her personal or family history in the future.   Mary Cunningham'sTyrer-Cuzick risk score is 14.4%.  This is above the general population risk of 12%, therefore, she is encouraged to continue to be mindful of her family history and be diligent with general population breast screening, including annual mammograms.  She is encouraged to contact us regarding any changes to her personal or family history, as her recommendations for screening would be altered significantly if her lifetime risk is determined to be greater than 20% based on updated information.    RECOMMENDATIONS FOR FAMILY MEMBERS:   Since she did not inherit a mutation in a cancer predisposition gene included on this panel, her son could not have  inherited a mutation from her in one of these genes. Individuals in this family might be at some increased risk of developing cancer, over the general population risk, due to the family history of cancer. We recommend women in this family have a yearly mammogram beginning at age 49, or 21 years younger than the earliest onset of cancer, an annual clinical breast exam, and perform monthly breast self-exams.  FOLLOW-UP:  Cancer genetics is a rapidly advancing field and it is possible that new genetic tests will be appropriate for her and/or her family members in the future. We encouraged her to remain in contact with cancer genetics on an annual basis so we can update her personal and family histories and let her know of advances in cancer genetics that may benefit this family.   Our contact number was provided. Ms. Bussey questions were answered to her satisfaction, and she knows she is welcome to call us at anytime with additional questions or concerns.   Lalla Brothers, MS, [facility] Genetic Counselor [email or web address] (P) [phone]

## 2023-09-11 DIAGNOSIS — F411 Generalized anxiety disorder: Secondary | ICD-10-CM | POA: Diagnosis not present

## 2023-10-02 DIAGNOSIS — R928 Other abnormal and inconclusive findings on diagnostic imaging of breast: Secondary | ICD-10-CM | POA: Diagnosis not present

## 2023-10-02 DIAGNOSIS — N6320 Unspecified lump in the left breast, unspecified quadrant: Secondary | ICD-10-CM | POA: Diagnosis not present

## 2023-10-02 DIAGNOSIS — N6342 Unspecified lump in left breast, subareolar: Secondary | ICD-10-CM | POA: Diagnosis not present

## 2023-10-07 ENCOUNTER — Other Ambulatory Visit: Payer: Self-pay | Admitting: Obstetrics & Gynecology

## 2023-10-07 DIAGNOSIS — N6320 Unspecified lump in the left breast, unspecified quadrant: Secondary | ICD-10-CM

## 2023-10-09 ENCOUNTER — Other Ambulatory Visit: Payer: Self-pay | Admitting: Obstetrics & Gynecology

## 2023-10-09 ENCOUNTER — Ambulatory Visit
Admission: RE | Admit: 2023-10-09 | Discharge: 2023-10-09 | Disposition: A | Discharge status: Home / Self Care | Payer: BC Managed Care – PPO | Source: Ambulatory Visit | Attending: Obstetrics & Gynecology | Admitting: Obstetrics & Gynecology

## 2023-10-09 ENCOUNTER — Ambulatory Visit
Admission: RE | Admit: 2023-10-09 | Discharge: 2023-10-09 | Discharge status: Home / Self Care | Payer: BC Managed Care – PPO | Source: Ambulatory Visit | Attending: Obstetrics & Gynecology | Admitting: Obstetrics & Gynecology

## 2023-10-09 DIAGNOSIS — N6342 Unspecified lump in left breast, subareolar: Secondary | ICD-10-CM | POA: Diagnosis not present

## 2023-10-09 DIAGNOSIS — N6320 Unspecified lump in the left breast, unspecified quadrant: Secondary | ICD-10-CM

## 2023-10-09 DIAGNOSIS — N6325 Unspecified lump in the left breast, overlapping quadrants: Secondary | ICD-10-CM | POA: Diagnosis not present

## 2024-05-09 DIAGNOSIS — F411 Generalized anxiety disorder: Secondary | ICD-10-CM | POA: Diagnosis not present

## 2024-05-16 DIAGNOSIS — F411 Generalized anxiety disorder: Secondary | ICD-10-CM | POA: Diagnosis not present

## 2024-05-23 DIAGNOSIS — F411 Generalized anxiety disorder: Secondary | ICD-10-CM | POA: Diagnosis not present

## 2024-05-30 DIAGNOSIS — F411 Generalized anxiety disorder: Secondary | ICD-10-CM | POA: Diagnosis not present

## 2024-07-11 DIAGNOSIS — F411 Generalized anxiety disorder: Secondary | ICD-10-CM | POA: Diagnosis not present

## 2025-03-25 ENCOUNTER — Encounter: Admitting: Family Medicine
